# Patient Record
Sex: Male | Born: 1940 | Race: White | Hispanic: No | Smoking: Former smoker
Health system: Southern US, Community
[De-identification: ages and names within clinical notes are randomized; demographics above are authoritative.]

## PROBLEM LIST (undated history)

## (undated) DIAGNOSIS — J9611 Chronic respiratory failure with hypoxia: Secondary | ICD-10-CM

## (undated) DIAGNOSIS — R5383 Other fatigue: Secondary | ICD-10-CM

## (undated) DIAGNOSIS — R7302 Impaired glucose tolerance (oral): Secondary | ICD-10-CM

## (undated) DIAGNOSIS — Z86010 Personal history of colonic polyps: Secondary | ICD-10-CM

## (undated) DIAGNOSIS — G629 Polyneuropathy, unspecified: Secondary | ICD-10-CM

## (undated) DIAGNOSIS — H60399 Other infective otitis externa, unspecified ear: Secondary | ICD-10-CM

## (undated) DIAGNOSIS — E739 Lactose intolerance, unspecified: Secondary | ICD-10-CM

## (undated) DIAGNOSIS — E785 Hyperlipidemia, unspecified: Secondary | ICD-10-CM

## (undated) DIAGNOSIS — I10 Essential (primary) hypertension: Secondary | ICD-10-CM

## (undated) DIAGNOSIS — R5381 Other malaise: Secondary | ICD-10-CM

## (undated) HISTORY — DX: Polyneuropathy, unspecified: G62.9

## (undated) HISTORY — DX: Hyperlipidemia, unspecified: E78.5

## (undated) HISTORY — DX: Other infective otitis externa, unspecified ear: H60.399

## (undated) HISTORY — DX: Lactose intolerance, unspecified: E73.9

## (undated) HISTORY — DX: Other malaise: R53.81

## (undated) HISTORY — DX: Personal history of colonic polyps: Z86.010

## (undated) HISTORY — DX: Impaired glucose tolerance (oral): R73.02

## (undated) HISTORY — DX: Other fatigue: R53.83

## (undated) HISTORY — DX: Essential (primary) hypertension: I10

## (undated) HISTORY — DX: Chronic respiratory failure with hypoxia: J96.11

---

## 2005-08-09 ENCOUNTER — Ambulatory Visit: Payer: Self-pay | Admitting: Internal Medicine

## 2005-08-14 ENCOUNTER — Ambulatory Visit: Payer: Self-pay | Admitting: Internal Medicine

## 2005-08-30 ENCOUNTER — Ambulatory Visit: Payer: Self-pay | Admitting: Gastroenterology

## 2005-09-12 ENCOUNTER — Ambulatory Visit: Payer: Self-pay | Admitting: Gastroenterology

## 2006-10-11 ENCOUNTER — Ambulatory Visit: Payer: Self-pay | Admitting: Internal Medicine

## 2006-10-11 LAB — CONVERTED CEMR LAB
ALT: 23 units/L (ref 0–40)
AST: 27 units/L (ref 0–37)
Basophils Relative: 0.7 % (ref 0.0–1.0)
Bilirubin Urine: NEGATIVE
Calcium: 9.5 mg/dL (ref 8.4–10.5)
Chloride: 101 meq/L (ref 96–112)
Chol/HDL Ratio, serum: 5.8
Creatinine, Ser: 1 mg/dL (ref 0.4–1.5)
Eosinophil percent: 2 % (ref 0.0–5.0)
GFR calc non Af Amer: 80 mL/min
HCT: 46.4 % (ref 39.0–52.0)
HDL: 28.5 mg/dL — ABNORMAL LOW (ref >39.0)
Hemoglobin, Urine: NEGATIVE
Hemoglobin: 15.8 g/dL (ref 13.0–17.0)
Lymphocytes Relative: 24.4 % (ref 12.0–46.0)
MCHC: 34 g/dL (ref 30.0–36.0)
MCV: 93.2 fL (ref 78.0–100.0)
Monocytes Absolute: 0.5 10*3/uL (ref 0.2–0.7)
Neutro Abs: 5.4 10*3/uL (ref 1.4–7.7)
Neutrophils Relative %: 66.3 % (ref 43.0–77.0)
RBC: 4.98 M/uL (ref 4.22–5.81)
Sodium: 136 meq/L (ref 135–145)
Specific Gravity, Urine: <=1.005 (ref 1.000–1.03)
TSH: 1.53 microintl units/mL (ref 0.35–5.50)
Total Protein, Urine: NEGATIVE mg/dL
Triglyceride fasting, serum: 122 mg/dL (ref 0–149)
Urine Glucose: NEGATIVE mg/dL
VLDL: 24 mg/dL (ref 0–40)
WBC: 8.2 10*3/uL (ref 4.5–10.5)

## 2006-10-17 ENCOUNTER — Ambulatory Visit: Payer: Self-pay | Admitting: Internal Medicine

## 2008-12-29 ENCOUNTER — Encounter: Payer: Self-pay | Admitting: Internal Medicine

## 2009-02-14 ENCOUNTER — Encounter: Payer: Self-pay | Admitting: Internal Medicine

## 2009-07-26 ENCOUNTER — Ambulatory Visit: Payer: Self-pay | Admitting: Internal Medicine

## 2009-07-26 DIAGNOSIS — Z860100 Personal history of colon polyps, unspecified: Secondary | ICD-10-CM | POA: Insufficient documentation

## 2009-07-26 DIAGNOSIS — E739 Lactose intolerance, unspecified: Secondary | ICD-10-CM

## 2009-07-26 DIAGNOSIS — R5383 Other fatigue: Secondary | ICD-10-CM

## 2009-07-26 DIAGNOSIS — E785 Hyperlipidemia, unspecified: Secondary | ICD-10-CM

## 2009-07-26 DIAGNOSIS — R5381 Other malaise: Secondary | ICD-10-CM

## 2009-07-26 DIAGNOSIS — I10 Essential (primary) hypertension: Secondary | ICD-10-CM

## 2009-07-26 HISTORY — DX: Other malaise: R53.81

## 2009-07-26 HISTORY — DX: Personal history of colon polyps, unspecified: Z86.0100

## 2009-07-26 HISTORY — DX: Personal history of colonic polyps: Z86.010

## 2009-07-26 HISTORY — DX: Hyperlipidemia, unspecified: E78.5

## 2009-07-26 HISTORY — DX: Essential (primary) hypertension: I10

## 2009-07-26 HISTORY — DX: Lactose intolerance, unspecified: E73.9

## 2009-11-28 ENCOUNTER — Telehealth: Payer: Self-pay | Admitting: Internal Medicine

## 2009-11-28 DIAGNOSIS — H60399 Other infective otitis externa, unspecified ear: Secondary | ICD-10-CM

## 2009-11-28 HISTORY — DX: Other infective otitis externa, unspecified ear: H60.399

## 2010-04-04 ENCOUNTER — Telehealth: Payer: Self-pay | Admitting: Internal Medicine

## 2010-06-12 ENCOUNTER — Telehealth: Payer: Self-pay | Admitting: Internal Medicine

## 2010-08-03 ENCOUNTER — Encounter: Payer: Self-pay | Admitting: Internal Medicine

## 2010-08-03 ENCOUNTER — Ambulatory Visit: Payer: Self-pay | Admitting: Internal Medicine

## 2010-08-04 ENCOUNTER — Telehealth: Payer: Self-pay | Admitting: Internal Medicine

## 2010-08-04 LAB — CONVERTED CEMR LAB
AST: 26 units/L (ref 0–37)
Albumin: 4.2 g/dL (ref 3.5–5.2)
BUN: 15 mg/dL (ref 6–23)
Basophils Absolute: 0 10*3/uL (ref 0.0–0.1)
Bilirubin Urine: NEGATIVE
CO2: 26 meq/L (ref 19–32)
Eosinophils Absolute: 0.3 10*3/uL (ref 0.0–0.7)
Glucose, Bld: 99 mg/dL (ref 70–99)
HCT: 45 % (ref 39.0–52.0)
HDL: 35.9 mg/dL — ABNORMAL LOW (ref >39.00)
Hemoglobin: 15.8 g/dL (ref 13.0–17.0)
Iron: 101 ug/dL (ref 42–165)
Lymphs Abs: 3.1 10*3/uL (ref 0.7–4.0)
MCHC: 35.1 g/dL (ref 30.0–36.0)
MCV: 94.2 fL (ref 78.0–100.0)
Monocytes Absolute: 0.8 10*3/uL (ref 0.1–1.0)
Neutro Abs: 7.4 10*3/uL (ref 1.4–7.7)
Nitrite: NEGATIVE
PSA: 0.53 ng/mL (ref 0.10–4.00)
Potassium: 4.1 meq/L (ref 3.5–5.1)
RDW: 12.7 % (ref 11.5–14.6)
Sed Rate: 10 mm/hr (ref 0–22)
Sodium: 138 meq/L (ref 135–145)
TSH: 1.44 microintl units/mL (ref 0.35–5.50)
Total Protein, Urine: NEGATIVE mg/dL
Transferrin: 268.7 mg/dL (ref 212.0–360.0)
Urine Glucose: NEGATIVE mg/dL
VLDL: 79.6 mg/dL — ABNORMAL HIGH (ref 0.0–40.0)
pH: 5.5 (ref 5.0–8.0)

## 2010-08-09 ENCOUNTER — Encounter (INDEPENDENT_AMBULATORY_CARE_PROVIDER_SITE_OTHER): Payer: Self-pay | Admitting: *Deleted

## 2010-09-01 ENCOUNTER — Encounter (INDEPENDENT_AMBULATORY_CARE_PROVIDER_SITE_OTHER): Payer: Self-pay | Admitting: *Deleted

## 2010-09-01 ENCOUNTER — Encounter: Payer: Self-pay | Admitting: Internal Medicine

## 2010-09-06 ENCOUNTER — Ambulatory Visit: Payer: Self-pay | Admitting: Gastroenterology

## 2010-09-06 ENCOUNTER — Telehealth (INDEPENDENT_AMBULATORY_CARE_PROVIDER_SITE_OTHER): Payer: Self-pay | Admitting: *Deleted

## 2010-09-20 ENCOUNTER — Ambulatory Visit: Payer: Self-pay | Admitting: Gastroenterology

## 2010-09-20 LAB — HM COLONOSCOPY

## 2010-09-22 ENCOUNTER — Encounter: Payer: Self-pay | Admitting: Gastroenterology

## 2011-01-28 LAB — CONVERTED CEMR LAB
ALT: 25 units/L (ref 0–53)
BUN: 14 mg/dL (ref 6–23)
CO2: 29 meq/L (ref 19–32)
Chloride: 102 meq/L (ref 96–112)
Cholesterol: 206 mg/dL — ABNORMAL HIGH (ref 0–200)
Creatinine, Ser: 1 mg/dL (ref 0.4–1.5)
Eosinophils Absolute: 0.3 10*3/uL (ref 0.0–0.7)
Eosinophils Relative: 3 % (ref 0.0–5.0)
Glucose, Bld: 107 mg/dL — ABNORMAL HIGH (ref 70–99)
HCT: 44.9 % (ref 39.0–52.0)
Lymphs Abs: 2.6 10*3/uL (ref 0.7–4.0)
MCHC: 35 g/dL (ref 30.0–36.0)
MCV: 94.6 fL (ref 78.0–100.0)
Monocytes Absolute: 0.8 10*3/uL (ref 0.1–1.0)
Neutrophils Relative %: 62.3 % (ref 43.0–77.0)
Platelets: 234 10*3/uL (ref 150.0–400.0)
Potassium: 4.3 meq/L (ref 3.5–5.1)
TSH: 1.66 microintl units/mL (ref 0.35–5.50)
Total Bilirubin: 0.8 mg/dL (ref 0.3–1.2)
Total Protein: 7.9 g/dL (ref 6.0–8.3)
Triglycerides: 299 mg/dL — ABNORMAL HIGH (ref 0.0–149.0)
WBC: 9.7 10*3/uL (ref 4.5–10.5)

## 2011-01-30 NOTE — Progress Notes (Signed)
  Phone Note Refill Request  on April 04, 2010 11:54 AM  Refills Requested: Medication #1:  LISINOPRIL 5 MG TABS 1 by mouth once daily   Dosage confirmed as above?Dosage Confirmed   Notes: [facility] Dr. Sandre Kitty Mazon Initial call taken by: Scharlene Gloss,  April 04, 2010 11:54 AM    Prescriptions: LISINOPRIL 5 MG TABS (LISINOPRIL) 1 by mouth once daily  #90 x 0   Entered by:   Scharlene Gloss   Authorized by:   Corwin Levins MD   Signed by:   Scharlene Gloss on 04/04/2010   Method used:   Faxed to ...       [facility] [phone]* (retail)       [address]       [place], [place]  [address]       Ph: [phone]       Fax: [phone]   RxID:   [id number]

## 2011-01-30 NOTE — Letter (Signed)
Summary: Previsit letter  [facility]  [address] [place], [place] [address]   Phone: [phone]  Fax: [phone]       08/09/2010 MRN: [id number]  Derrick Petersen [address] [place], [place]  [address]  Dear Derrick Petersen,  Welcome to the Gastroenterology Division at [facility].    You are scheduled to see a nurse for your pre-procedure visit on 09-06-10 at 10:00a.m. on the 3rd floor at [facility], [address]  We ask that you try to arrive at our office 15 minutes prior to your appointment time to allow for check-in.  Your nurse visit will consist of discussing your medical and surgical history, your immediate family medical history, and your medications.    Please bring a complete list of all your medications or, if you prefer, bring the medication bottles and we will list them.  We will need to be aware of both prescribed and over the counter drugs.  We will need to know exact dosage information as well.  If you are on blood thinners (Coumadin, Plavix, Aggrenox, Ticlid, etc.) please call our office today/prior to your appointment, as we need to consult with your physician about holding your medication.   Please be prepared to read and sign documents such as consent forms, a financial agreement, and acknowledgement forms.  If necessary, and with your consent, a friend or relative is welcome to sit-in on the nurse visit with you.  Please bring your insurance card so that we may make a copy of it.  If your insurance requires a referral to see a specialist, please bring your referral form from your primary care physician.  No co-pay is required for this nurse visit.     If you cannot keep your appointment, please call [phone] to cancel or reschedule prior to your appointment date.  This allows [place] the opportunity to schedule an appointment for another patient in need of care.    Thank you for choosing [facility] for your medical needs.   We appreciate the opportunity to care for you.  Please visit [place] at our website  to learn more about our practice.                     Sincerely.                                                                                                                   The Gastroenterology Division

## 2011-01-30 NOTE — Progress Notes (Signed)
Summary: prep ?'s   Phone Note Call from Patient Call back at Home Phone [phone]   Caller: Patient Call For: Dr. Jarold Motto Reason for Call: Talk to Nurse Summary of Call: prep ?'s Initial call taken by: Vallarie Mare,  September 06, 2010 3:47 PM  Follow-up for Phone Call        Pt wanted to let us know that he was taking Lisinopril and wanted to know if it was okay to use Moviprep while taking Lisinopril.  Assured pt that it would be okay to take Lisinopril. Follow-up by: Ezra Sites RN,  September 06, 2010 4:21 PM

## 2011-01-30 NOTE — Letter (Signed)
Summary: [facility] Instructions  [facility]  [address] [place], [place] [address]   Phone: [phone]  Fax: [phone]       Derrick Petersen    69/25/1942    MRN: [id number]        Procedure Day Dorna Bloom:   Wednesday   09-20-10     Arrival Time:  7:30 a.m.     Procedure Time:  8:30 a.m.     Location of Procedure:                    _x _  [facility] (4th Floor)                       PREPARATION FOR COLONOSCOPY WITH MOVIPREP   Starting 5 days prior to your procedure  09-15-10 do not eat nuts, seeds, popcorn, corn, beans, peas,  salads, or any raw vegetables.  Do not take any fiber supplements (e.g. Metamucil, Citrucel, and Benefiber).  THE DAY BEFORE YOUR PROCEDURE         DATE:  09-19-10   DAY: Tuesday  1.  Drink clear liquids the entire day-NO SOLID FOOD  2.  Do not drink anything colored red or purple.  Avoid juices with pulp.  No orange juice.  3.  Drink at least 64 oz. (8 glasses) of fluid/clear liquids during the day to prevent dehydration and help the prep work efficiently.  CLEAR LIQUIDS INCLUDE: Water Jello Ice Popsicles Tea (sugar ok, no milk/cream) Powdered fruit flavored drinks Coffee (sugar ok, no milk/cream) Gatorade Juice: apple, white grape, white cranberry  Lemonade Clear bullion, consomm, broth Carbonated beverages (any kind) Strained chicken noodle soup Hard Candy                             4.  In the morning, mix first dose of MoviPrep solution:    Empty 1 Pouch A and 1 Pouch B into the disposable container    Add lukewarm drinking water to the top line of the container. Mix to dissolve    Refrigerate (mixed solution should be used within 24 hrs)  5.  Begin drinking the prep at 5:00 p.m. The MoviPrep container is divided by 4 marks.   Every 15 minutes drink the solution down to the next mark (approximately 8 oz) until the full liter is complete.   6.  Follow completed prep with 16 oz of clear liquid of your  choice (Nothing red or purple).  Continue to drink clear liquids until bedtime.  7.  Before going to bed, mix second dose of MoviPrep solution:    Empty 1 Pouch A and 1 Pouch B into the disposable container    Add lukewarm drinking water to the top line of the container. Mix to dissolve    Refrigerate  THE DAY OF YOUR PROCEDURE      DATE:  09-20-10  DAY: Wednesday  Beginning at  3:30 a.m. (5 hours before procedure):         1. Every 15 minutes, drink the solution down to the next mark (approx 8 oz) until the full liter is complete.  2. Follow completed prep with 16 oz. of clear liquid of your choice.    3. You may drink clear liquids until  6:30 a.m. (2 HOURS BEFORE PROCEDURE).   MEDICATION INSTRUCTIONS  Unless otherwise instructed, you should take regular prescription medications with a small  sip of water   as early as possible the morning of your procedure.       OTHER INSTRUCTIONS  You will need a responsible adult at least 69 years of age to accompany you and drive you home.   This person must remain in the waiting room during your procedure.  Wear loose fitting clothing that is easily removed.  Leave jewelry and other valuables at home.  However, you may wish to bring a book to read or  an iPod/MP3 player to listen to music as you wait for your procedure to start.  Remove all body piercing jewelry and leave at home.  Total time from sign-in until discharge is approximately 2-3 hours.  You should go home directly after your procedure and rest.  You can resume normal activities the  day after your procedure.  The day of your procedure you should not:   Drive   Make legal decisions   Operate machinery   Drink alcohol   Return to work  You will receive specific instructions about eating, activities and medications before you leave.    The above instructions have been reviewed and explained to me by   Wyona Almas RN  September 06, 2010 10:29  AM     I fully understand and can verbalize these instructions _____________________________ Date _________

## 2011-01-30 NOTE — Progress Notes (Signed)
Summary: Rx refill req  Phone Note Call from Patient Call back at Home Phone [phone]   Caller: Patient Summary of Call: pt called requesting #30 of BP meds to last until appt 08/04. Pt has medication now but will run out in July.  Initial call taken by: Margaret Pyle, CMA,  June 12, 2010 10:49 AM    Prescriptions: LISINOPRIL 5 MG TABS (LISINOPRIL) 1 by mouth once daily  #30 x 0   Entered by:   Margaret Pyle, CMA   Authorized by:   Corwin Levins MD   Signed by:   Margaret Pyle, CMA on 06/12/2010   Method used:   Electronically to        [facility] [phone]* (retail)       [address]       [place], [place]  [address]       Ph: [phone]       Fax: [phone]   RxID:   [id number]

## 2011-01-30 NOTE — Miscellaneous (Signed)
Summary: [facility] Previsit/prep  Clinical Lists Changes  Medications: Added new medication of MOVIPREP 100 GM  SOLR (PEG-KCL-NACL-NASULF-NA ASC-C) As per prep instructions. - Signed Rx of MOVIPREP 100 GM  SOLR (PEG-KCL-NACL-NASULF-NA ASC-C) As per prep instructions.;  #1 x 0;  Signed;  Entered by: Wyona Almas RN;  Authorized by: Mardella Layman MD [facility];  Method used: Electronically to [facility] [phone]*, [address] [place], [place]  [address], Ph: [phone], Fax: [phone] Observations: Added new observation of NKA: T (09/06/2010 10:06)    Prescriptions: MOVIPREP 100 GM  SOLR (PEG-KCL-NACL-NASULF-NA ASC-C) As per prep instructions.  #1 x 0   Entered by:   Wyona Almas RN   Authorized by:   Mardella Layman MD [facility]   Signed by:   Wyona Almas RN on 09/06/2010   Method used:   Electronically to        [facility] [phone]* (retail)       [address]       [place], [place]  [address]       Ph: [phone]       Fax: [phone]   RxID:   [id number]

## 2011-01-30 NOTE — Procedures (Signed)
Summary: Colonoscopy  Patient: [name] Note: All result statuses are Final unless otherwise noted.  Tests: (1) Colonoscopy (COL)   COL Colonoscopy           DONE     [facility]     [address]     [place], [place]  [address]           COLONOSCOPY PROCEDURE REPORT           PATIENT:  Derrick Petersen, Derrick Petersen  MR#:  [id number]     BIRTHDATE:  30-May-1941, 69 yrs. old  GENDER:  male     ENDOSCOPIST:  Vania Rea. Jarold Motto, MD, [facility]     REF. BY:     PROCEDURE DATE:  09/20/2010     PROCEDURE:  Colonoscopy with snare polypectomy     ASA CLASS:  Class II     INDICATIONS:  history of polyps     MEDICATIONS:   Fentanyl 50 mcg IV, Versed 5 gm IV           DESCRIPTION OF PROCEDURE:   After the risks benefits and     alternatives of the procedure were thoroughly explained, informed     consent was obtained.  Digital rectal exam was performed and     revealed no abnormalities.   The LB [id number] endoscope     was introduced through the anus and advanced to the cecum, which     was identified by both the appendix and ileocecal valve, without     limitations.  The quality of the prep was Moviprep fair.  The     instrument was then slowly withdrawn as the colon was fully     examined.     <<PROCEDUREIMAGES>>           FINDINGS:  A pedunculated polyp was found. flat,sessile 4mm polyp     cold snare excised fron cecum,,,  This was otherwise a normal     examination of the colon.   Retroflexed views in the rectum     revealed no abnormalities.    The scope was then withdrawn from     the patient and the procedure completed.           COMPLICATIONS:  None     ENDOSCOPIC IMPRESSION:     1) Pedunculated polyp     2) Otherwise normal examination     RECOMMENDATIONS:     1) If the polyp(s) removed today are proven to be adenomatous     (pre-cancerous) polyps, you will need a repeat colonoscopy in 5     years. Otherwise you should continue to follow colorectal cancer     screening  guidelines for "routine risk" patients with colonoscopy     in 10 years.     REPEAT EXAM:  No           ______________________________     Vania Rea. Jarold Motto, MD, Clementeen Graham           CC:           n.     eSIGNED:   Vania Rea. Patterson at 09/20/2010 09:14 AM           Charlaine Dalton, [phone]  Note: An exclamation mark (!) indicates a result that was not dispersed into the flowsheet. Document Creation Date: 09/20/2010 9:14 AM _______________________________________________________________________  (1) Order result status: Final Collection or observation date-time: 09/20/2010 09:05 Requested date-time:  Receipt date-time:  Reported date-time:  Referring Physician:  Ordering Physician: Sheryn Bison [phone]) Specimen Source:  Source: Launa Grill Order Number: [id number] Lab site:   Appended Document: Colonoscopy     Procedures Next Due Date:    Colonoscopy: 09/2015

## 2011-01-30 NOTE — Progress Notes (Signed)
  Phone Note From Pharmacy   Caller: Raynald Kemp [phone]* Summary of Call: Pharmacy requesting clarification, theNiacin CR 750  prescription should it be Niaspan? Initial call taken by: Robin Ewing CMA Duncan Dull),  August 04, 2010 11:36 AM  Follow-up for Phone Call        ok with me; the one I sent was the one that came up on the Eprescribe program Follow-up by: Corwin Levins MD,  August 04, 2010 11:40 AM    New/Updated Medications: NIASPAN 750 MG CR-TABS (NIACIN (ANTIHYPERLIPIDEMIC)) 1 by mouth once daily Prescriptions: NIASPAN 750 MG CR-TABS (NIACIN (ANTIHYPERLIPIDEMIC)) 1 by mouth once daily  #90 x 3   Entered by:   Zella Ball Ewing CMA (AAMA)   Authorized by:   Corwin Levins MD   Signed by:   Scharlene Gloss CMA (AAMA) on 08/04/2010   Method used:   Faxed to ...       [facility] [phone]* (retail)       [address]       [place], [place]  [address]       Ph: [phone]       Fax: [phone]   RxID:   [id number]

## 2011-01-30 NOTE — Letter (Signed)
Summary: Patient Notice- Polyp Results  [facility]  [address] [place], [place] [address]   Phone: [phone]  Fax: [phone]        September 22, 2010 MRN: [id number]    Derrick Petersen [address] [place], [place]  [address]    Dear Mr. BELSON,  I am pleased to inform you that the colon polyp(s) removed during your recent colonoscopy was (were) found to be benign (no cancer detected) upon pathologic examination.  I recommend you have a repeat colonoscopy examination in 5_ years to look for recurrent polyps, as having colon polyps increases your risk for having recurrent polyps or even colon cancer in the future.  Should you develop new or worsening symptoms of abdominal pain, bowel habit changes or bleeding from the rectum or bowels, please schedule an evaluation with either your primary care physician or with me.  Additional information/recommendations:  _x_ No further action with gastroenterology is needed at this time. Please      follow-up with your primary care physician for your other healthcare      needs.  __ Please call [phone] to schedule a return visit to review your      situation.  __ Please keep your follow-up visit as already scheduled.  __ Continue treatment plan as outlined the day of your exam.  Please call us if you are having persistent problems or have questions about your condition that have not been fully answered at this time.  Sincerely,  Mardella Layman MD [facility]  This letter has been electronically signed by your physician.  Appended Document: Patient Notice- Polyp Results letter mailed

## 2011-01-30 NOTE — Assessment & Plan Note (Signed)
Summary: yearly / medicare / #/cd   Vital Signs:  Patient profile:   70 year old male Height:      71 inches Weight:      214 pounds BMI:     29.95 O2 Sat:      96 % on Room air Temp:     97.1 degrees F oral Pulse rate:   65 / minute BP sitting:   148 / 80  (left arm) Cuff size:   large  Vitals Entered By: Zella Ball Ewing CMA Duncan Dull) (August 03, 2010 9:38 AM)  O2 Flow:  Room air  Preventive Care Screening  Colonoscopy:    Date:  01/01/2004    Next Due:  12/2008    Results:  normal   CC: yearly/RE   CC:  yearly/RE.  History of Present Illness: overall doing well, no specific complaints;  Pt denies CP, sob, doe, wheezing, orthopnea, pnd, worsening LE edema, palps, dizziness or syncope  Pt denies new neuro symptoms such as headache, facial or extremity weakness Denies polydipsia or polyuria.  No fever, wt loss, night sweats, loss of appetite or other constitutional symptoms  No worsening prostatism., reflux or allergies. 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 with slight hearing loss at higher frequencies Visual Acuity: Grossly normal, gets exam yearly, wears glasses ADL's: Capable  Fall Risk: None Home Safety: Good Cognitive Impairment:  Gen appearance, affect, speech, memory, attention & motor skills grossly intact End-of-Life Planning: Advance directive - Full code/I agree   Problems Prior to Update: 1)  Otitis Externa  (ICD-380.10) 2)  Colonic Polyps, Hx of  (ICD-V12.72) 3)  Glucose Intolerance  (ICD-271.3) 4)  Special Screening Malig Neoplasms Other Sites  (ICD-V76.49) 5)  Fatigue  (ICD-780.79) 6)  Hyperlipidemia  (ICD-272.4) 7)  Hypertension  (ICD-401.9)  Medications Prior to Update: 1)  Lisinopril 5 Mg Tabs (Lisinopril) .Marland Kitchen.. 1 By Mouth Once  Daily 2)  Aspir-Low 81 Mg Tbec (Aspirin) .... 2 By Mouth Once Daily 3)  Niacin Cr 500 Mg Cr-Caps (Niacin) .Marland Kitchen.. 1 By Mouth Once Daily  Current Medications (verified): 1)  Lisinopril 10 Mg Tabs (Lisinopril) .Marland Kitchen.. 1po Once Daily 2)  Aspir-Low 81 Mg Tbec (Aspirin) .... 2 By Mouth Once Daily 3)  Niacin Cr 750 Mg Cr-Tabs (Niacin) .Marland Kitchen.. 1po Once Daily 4)  Multivitamins  Tabs (Multiple Vitamin) .Marland Kitchen.. 1 By Mouth Once Daily 5)  Fish Oil 1000 Mg Caps (Omega-3 Fatty Acids) .Marland Kitchen.. 1 By Mouth Once Daily 6)  Vitamin D 1000 Unit Tabs (Cholecalciferol) .... 2 By Mouth Once Daily 7)  Flax Seed Oil 1000 Mg Caps (Flaxseed (Linseed)) .Marland Kitchen.. 1 By Mouth Once Daily 8)  Magnesium 300 Mg Caps (Magnesium) .Marland Kitchen.. 1 By Mouth Once Daily 9)  Iron 325 (65 Fe) Mg Tabs (Ferrous Sulfate) .Marland Kitchen.. 1 By Mouth Once Daily 10)  Glucosamine 500 Mg Caps (Glucosamine Sulfate) .Marland Kitchen.. 1 By Mouth Once Daily  Allergies (verified): No Known Drug Allergies  Past History:  Past Medical History: Last updated: 2009-08-02 Hypertension Hyperlipidemia DJD glucose intolerance Colonic polyps, hx of Erectile dysfunction  Past Surgical History: Last updated: 08-02-2009 Denies surgical history  Family History: Last updated: 2009/08/02 mother died with aspirate pneumonia, HTN colon cancer  - father died at 17yo DM - aunt  Social History: Last updated: Aug 02, 2009  retired [employer] hobby - computer/light wts Divorced no children Never Smoked Alcohol use-yes  Risk Factors: Smoking Status: never (07/26/2009)  Review of Systems  The patient denies anorexia, fever, vision loss, decreased hearing, hoarseness, chest pain, syncope, dyspnea on exertion, peripheral edema, prolonged cough, headaches, hemoptysis, abdominal pain, melena, hematochezia, severe indigestion/heartburn, hematuria, muscle weakness, suspicious skin lesions, transient blindness, difficulty walking, depression, unusual weight change, abnormal bleeding,  enlarged lymph nodes, and angioedema.         all otherwise negative per pt -  except for mild ongoing fatigue - without OSA symtpoms or worsening depressive symptoms   Physical Exam  General:  alert and overweight-appearing.   Head:  normocephalic and atraumatic.   Eyes:  vision grossly intact, pupils equal, and pupils round.   Ears:  R ear normal and L ear normal.   Nose:  no external deformity and no nasal discharge.   Mouth:  no gingival abnormalities and pharynx pink and moist.   Neck:  supple and no masses.   Lungs:  normal respiratory effort and normal breath sounds.   Heart:  normal rate and regular rhythm.   Abdomen:  soft, non-tender, and normal bowel sounds.   Msk:  no joint tenderness and no joint swelling.   Extremities:  no edema, no erythema  Neurologic:  cranial nerves II-XII intact and strength normal in all extremities.   Skin:  color normal and no rashes.   Psych:  not anxious appearing and not depressed appearing.     Impression & Recommendations:  Problem # 1:  Preventive Health Care (ICD-V70.0)  Overall doing well, age appropriate education and counseling updated and referral for appropriate preventive services done unless declined, immunizations up to date or declined, diet counseling done if overweight, urged to quit smoking if smokes , most recent labs reviewed and current ordered if appropriate, ecg reviewed or declined (interpretation per ECG scanned in the EMR if done); information regarding Medicare Prevention requirements given if appropriate; speciality referrals updated as appropriate   Orders: EKG w/ Interpretation (93000) First annual wellness visit with prevention plan  (X9147)  Problem # 2:  NEOPLASM, MALIGNANT, COLON, FAMILY HX, FATHER (ICD-V16.0) due for colonoscopy Orders: Gastroenterology Referral (GI)  Problem # 3:  FATIGUE (ICD-780.79)  exam benign, to check labs below; follow with expectant management   Orders: TLB-BMP (Basic  Metabolic Panel-BMET) (80048-METABOL) TLB-CBC Platelet - w/Differential (85025-CBCD) TLB-Hepatic/Liver Function Pnl (80076-HEPATIC) TLB-TSH (Thyroid Stimulating Hormone) (84443-TSH) TLB-Sedimentation Rate (ESR) (85652-ESR) TLB-IBC Pnl (Iron/FE;Transferrin) (83550-IBC) TLB-B12 + Folate Pnl (82746_82607-B12/FOL) TLB-Udip ONLY (81003-UDIP) T-Vitamin D (25-Hydroxy) (82956-21308)  Problem # 4:  GLUCOSE INTOLERANCE (ICD-271.3) aasympt - Pt to cont DM diet, excercise, wt loss efforts; to check labs today  Orders: TLB-A1C / Hgb A1C (Glycohemoglobin) (83036-A1C)  Problem # 5:  HYPERLIPIDEMIA (ICD-272.4) Assessment: Improved  His updated medication list for this problem includes:    Niacin Cr 750 Mg Cr-tabs (Niacin) .Marland Kitchen... 1po once daily  Labs Reviewed: SGOT: 28 (07/26/2009)   SGPT: 25 (07/26/2009)   HDL:31.30 (07/26/2009), 28.5 (10/11/2006)  LDL:112 CALC (10/11/2006)  Chol:206 (07/26/2009), 165 (10/11/2006)  Trig:299.0 (07/26/2009), 122 (10/11/2006) to incr the niacin as above, cont diet and f/u labs next visit  Orders: TLB-Lipid Panel (80061-LIPID) Prescription Created Electronically [phone])  Problem # 6:  HYPERTENSION (ICD-401.9)  His updated medication list for this problem includes:    Lisinopril 10 Mg Tabs (Lisinopril) .Marland Kitchen... 1po once daily  BP today: 148/80 Prior BP: 136/72 (07/26/2009)  Labs Reviewed: K+: 4.3 (07/26/2009)  Creat: : 1.0 (07/26/2009)   Chol: 206 (07/26/2009)   HDL: 31.30 (07/26/2009)   LDL: 112 CALC (10/11/2006)   TG: 299.0 (07/26/2009) to increase the ace as above - o/w stable overall by hx and exam, ok to continue meds/tx as is   Complete Medication List: 1)  Lisinopril 10 Mg Tabs (Lisinopril) .Marland Kitchen.. 1po once daily 2)  Aspir-low 81 Mg Tbec (Aspirin) .... 2 by mouth once daily 3)  Niacin Cr 750 Mg Cr-tabs (Niacin) .Marland Kitchen.. 1po once daily 4)  Multivitamins Tabs (Multiple vitamin) .Marland Kitchen.. 1 by mouth once daily 5)  Fish Oil 1000 Mg Caps (Omega-3 fatty acids) .Marland Kitchen.. 1 by  mouth once daily 6)  Vitamin D 1000 Unit Tabs (Cholecalciferol) .... 2 by mouth once daily 7)  Flax Seed Oil 1000 Mg Caps (Flaxseed (linseed)) .Marland Kitchen.. 1 by mouth once daily 8)  Magnesium 300 Mg Caps (Magnesium) .Marland Kitchen.. 1 by mouth once daily 9)  Iron 325 (65 Fe) Mg Tabs (Ferrous sulfate) .Marland Kitchen.. 1 by mouth once daily 10)  Glucosamine 500 Mg Caps (Glucosamine sulfate) .Marland Kitchen.. 1 by mouth once daily  Other Orders: TLB-PSA (Prostate Specific Antigen) (84153-PSA)  Patient Instructions: 1)  You will be contacted about the referral(s) to: colonoscopy 2)  Please go to the Lab in the basement for your blood and/or urine tests today 3)  increase the lisinopril to 10 mg per day 4)  increase the Niacin CR to 750 mg per day 5)  Continue all other previous medications as before this visit 6)  Please schedule a follow-up appointment in 1 year or sooner if needed Prescriptions: LISINOPRIL 10 MG TABS (LISINOPRIL) 1po once daily  #90 x 3   Entered and Authorized by:   Corwin Levins MD   Signed by:   Corwin Levins MD on 08/03/2010   Method used:   Electronically to        [facility] [phone]* (retail)       [address]       [place], [place]  [address]       Ph: [phone]       Fax: [phone]   RxID:   [id number] NIACIN CR 750 MG CR-TABS (NIACIN) 1po once daily  #90 x 3   Entered and Authorized by:   Corwin Levins MD   Signed by:   Corwin Levins MD on 08/03/2010   Method used:   Electronically to        [facility] [phone]* (retail)       [address]       [place], [place]  [address]       Ph: [phone]       Fax: [phone]   RxID:   [id number]

## 2011-08-02 ENCOUNTER — Other Ambulatory Visit: Payer: Self-pay | Admitting: Internal Medicine

## 2011-08-05 ENCOUNTER — Encounter: Payer: Self-pay | Admitting: Internal Medicine

## 2011-08-05 DIAGNOSIS — Z0000 Encounter for general adult medical examination without abnormal findings: Secondary | ICD-10-CM | POA: Insufficient documentation

## 2011-08-05 DIAGNOSIS — R7302 Impaired glucose tolerance (oral): Secondary | ICD-10-CM

## 2011-08-05 HISTORY — DX: Impaired glucose tolerance (oral): R73.02

## 2011-08-08 ENCOUNTER — Ambulatory Visit (INDEPENDENT_AMBULATORY_CARE_PROVIDER_SITE_OTHER): Payer: Medicare Other | Admitting: Internal Medicine

## 2011-08-08 ENCOUNTER — Other Ambulatory Visit: Payer: Self-pay | Admitting: Internal Medicine

## 2011-08-08 ENCOUNTER — Encounter: Payer: Self-pay | Admitting: Internal Medicine

## 2011-08-08 ENCOUNTER — Other Ambulatory Visit (INDEPENDENT_AMBULATORY_CARE_PROVIDER_SITE_OTHER): Payer: Medicare Other

## 2011-08-08 VITALS — BP 134/82 | HR 69 | Temp 97.3°F | Ht 71.0 in | Wt 219.1 lb

## 2011-08-08 DIAGNOSIS — R7302 Impaired glucose tolerance (oral): Secondary | ICD-10-CM

## 2011-08-08 DIAGNOSIS — R7309 Other abnormal glucose: Secondary | ICD-10-CM

## 2011-08-08 DIAGNOSIS — N320 Bladder-neck obstruction: Secondary | ICD-10-CM | POA: Insufficient documentation

## 2011-08-08 DIAGNOSIS — R5383 Other fatigue: Secondary | ICD-10-CM

## 2011-08-08 DIAGNOSIS — R5381 Other malaise: Secondary | ICD-10-CM

## 2011-08-08 DIAGNOSIS — E785 Hyperlipidemia, unspecified: Secondary | ICD-10-CM

## 2011-08-08 LAB — LIPID PANEL
HDL: 36.8 mg/dL — ABNORMAL LOW (ref >39.00)
Total CHOL/HDL Ratio: 6
Triglycerides: 373 mg/dL — ABNORMAL HIGH (ref 0.0–149.0)
VLDL: 74.6 mg/dL — ABNORMAL HIGH (ref 0.0–40.0)

## 2011-08-08 LAB — CBC WITH DIFFERENTIAL/PLATELET
Basophils Relative: 0.4 % (ref 0.0–3.0)
Eosinophils Relative: 2.9 % (ref 0.0–5.0)
HCT: 47.3 % (ref 39.0–52.0)
Hemoglobin: 16.4 g/dL (ref 13.0–17.0)
Lymphs Abs: 2.6 10*3/uL (ref 0.7–4.0)
MCV: 95 fl (ref 78.0–100.0)
Monocytes Absolute: 0.8 10*3/uL (ref 0.1–1.0)
Monocytes Relative: 7 % (ref 3.0–12.0)
Platelets: 250 10*3/uL (ref 150.0–400.0)
RBC: 4.98 Mil/uL (ref 4.22–5.81)
WBC: 11 10*3/uL — ABNORMAL HIGH (ref 4.5–10.5)

## 2011-08-08 LAB — TSH: TSH: 1.43 u[IU]/mL (ref 0.35–5.50)

## 2011-08-08 LAB — BASIC METABOLIC PANEL
BUN: 15 mg/dL (ref 6–23)
Calcium: 9.8 mg/dL (ref 8.4–10.5)
Creatinine, Ser: 1.1 mg/dL (ref 0.4–1.5)
GFR: 71.22 mL/min (ref >60.00)
Glucose, Bld: 105 mg/dL — ABNORMAL HIGH (ref 70–99)
Potassium: 4.8 mEq/L (ref 3.5–5.1)

## 2011-08-08 LAB — URINALYSIS, ROUTINE W REFLEX MICROSCOPIC
Bilirubin Urine: NEGATIVE
Ketones, ur: NEGATIVE
Leukocytes, UA: NEGATIVE
Nitrite: NEGATIVE
Specific Gravity, Urine: 1.015 (ref 1.000–1.030)
Total Protein, Urine: NEGATIVE
pH: 6.5 (ref 5.0–8.0)

## 2011-08-08 LAB — HEPATIC FUNCTION PANEL
Albumin: 4.3 g/dL (ref 3.5–5.2)
Total Bilirubin: 0.7 mg/dL (ref 0.3–1.2)

## 2011-08-08 LAB — PSA: PSA: 0.58 ng/mL (ref 0.10–4.00)

## 2011-08-08 MED ORDER — LISINOPRIL 10 MG PO TABS: 10.0000 mg | ORAL_TABLET | Freq: Every day | ORAL | Status: DC

## 2011-08-08 MED ORDER — NIACIN ER (ANTIHYPERLIPIDEMIC) 500 MG PO TBCR: 500.0000 mg | EXTENDED_RELEASE_TABLET | Freq: Every day | ORAL | Status: DC

## 2011-08-08 MED ORDER — ASPIRIN 81 MG PO TABS: ORAL_TABLET | ORAL | Status: DC

## 2011-08-08 NOTE — Patient Instructions (Signed)
Continue all other medications as before Please go to LAB in the Basement for the blood and/or urine tests to be done today Please call the phone number [phone] (the PhoneTree System) for results of testing in 2-3 days;  When calling, simply dial the number, and when prompted enter the MRN number above (the Medical Record Number) and the # key, then the message should start. You are otherwise up to date with prevention measures Your medication refill was sent to the pharmacy Please return in 1 year for your yearly visit, or sooner if needed

## 2011-08-08 NOTE — Progress Notes (Signed)
Subjective:    Patient ID: Derrick Petersen, male    DOB: 10/10/1941, 70 y.o.   MRN: [id number]  HPI Here for f/u;  Overall doing ok;  Pt denies CP, worsening SOB, DOE, wheezing, orthopnea, PND, worsening LE edema, palpitations, dizziness or syncope.  Pt denies neurological change such as new Headache, facial or extremity weakness.  Pt denies polydipsia, polyuria, or low sugar symptoms. Pt states overall good compliance with treatment and medications, good tolerability, and trying to follow lower cholesterol diet.  Pt denies worsening depressive symptoms, suicidal ideation or panic. No fever, wt loss, night sweats, loss of appetite, or other constitutional symptoms.  Pt states good ability with ADL's, low fall risk, home safety reviewed and adequate, no significant changes in hearing or vision, and occasionally active with exercise. Did have recent cxr, echo, and stress test and neg PFT sounding testing neg for emphysema per pt despite hx of smoking. Had some increased pulm pressures on echo, and mild aortic valve thickening only - all per Dr Shelva Majestic [facility]  [facility]. Has joined the gym at 3 times per wk with both bike ridiing and some wt lifting.  Etiology unclear, Exam otherwise benign, to check labs as documented, follow with expectant management  Past Medical History  Diagnosis Date  . COLONIC POLYPS, HX OF 07/26/2009  . FATIGUE 07/26/2009  . GLUCOSE INTOLERANCE 07/26/2009  . HYPERLIPIDEMIA 07/26/2009  . HYPERTENSION 07/26/2009  . OTITIS EXTERNA 11/28/2009  . Impaired glucose tolerance 08/05/2011   No past surgical history on file.  reports that he has never smoked. He does not have any smokeless tobacco history on file. He reports that he drinks alcohol. His drug history not on file. family history includes Diabetes in his other and Hypertension in his mother. No Known Allergies Current Outpatient Prescriptions on File Prior to Visit  Medication Sig Dispense Refill  . aspirin 81 MG  tablet Take 81 mg by mouth daily.        . cholecalciferol (VITAMIN D) 1000 UNITS tablet 2 by mouth once daily       . ferrous gluconate (FERGON) 325 MG tablet Take 325 mg by mouth daily.        . Flaxseed, Linseed, (FLAX SEED OIL) 1000 MG CAPS Take by mouth daily.        . Glucosamine 500 MG CAPS Take by mouth daily.        Marland Kitchen lisinopril (PRINIVIL,ZESTRIL) 10 MG tablet TAKE ONE TABLET BY MOUTH EVERY DAY  30 tablet  1  . Magnesium 300 MG CAPS Take by mouth daily.        . Multiple Vitamin (MULTIVITAMIN) capsule Take 1 capsule by mouth daily.        . niacin (NIASPAN) 750 MG CR tablet Take 750 mg by mouth daily.        . Omega-3 Fatty Acids (FISH OIL) 1000 MG CAPS Take by mouth daily.          Review of Systems Review of Systems  Constitutional: Negative for diaphoresis, activity change, appetite change and unexpected weight change.  HENT: Negative for hearing loss, ear pain, facial swelling, mouth sores and neck stiffness.   Eyes: Negative for pain, redness and visual disturbance.  Respiratory: Negative for shortness of breath and wheezing.   Cardiovascular: Negative for chest pain and palpitations.  Gastrointestinal: Negative for diarrhea, blood in stool, abdominal distention and rectal pain.  Genitourinary: Negative for hematuria, flank pain and decreased urine volume.  Musculoskeletal:  Negative for myalgias and joint swelling.  Skin: Negative for color change and wound.  Neurological: Negative for syncope and numbness.  Hematological: Negative for adenopathy.  Psychiatric/Behavioral: Negative for hallucinations, self-injury, decreased concentration and agitation.      Objective:   Physical Exam BP 134/82  Pulse 69  Temp(Src) 97.3 F (36.3 C) (Oral)  Ht 5\' 11"  (1.803 m)  Wt 219 lb 2 oz (99.394 kg)  BMI 30.56 kg/m2  SpO2 99% Physical Exam  VS noted Constitutional: Pt is oriented to person, place, and time. Appears well-developed and well-nourished.  HENT:  Head: Normocephalic  and atraumatic.  Right Ear: External ear normal.  Left Ear: External ear normal.  Nose: Nose normal.  Mouth/Throat: Oropharynx is clear and moist.  Eyes: Conjunctivae and EOM are normal. Pupils are equal, round, and reactive to light.  Neck: Normal range of motion. Neck supple. No JVD present. No tracheal deviation present.  Cardiovascular: Normal rate, regular rhythm, normal heart sounds and intact distal pulses.   Pulmonary/Chest: Effort normal and breath sounds normal.  Abdominal: Soft. Bowel sounds are normal. There is no tenderness.  Musculoskeletal: Normal range of motion. Exhibits no edema.  Lymphadenopathy:  Has no cervical adenopathy.  Neurological: Pt is alert and oriented to person, place, and time. Pt has normal reflexes. No cranial nerve deficit.  Skin: Skin is warm and dry. No rash noted.  Psychiatric:  Has  normal mood and affect. Behavior is normal.         Assessment & Plan:

## 2011-08-09 LAB — VITAMIN D 25 HYDROXY (VIT D DEFICIENCY, FRACTURES): Vit D, 25-Hydroxy: 53 ng/mL (ref 30–89)

## 2011-08-09 NOTE — Progress Notes (Signed)
Quick Note:  Voice message left on PhoneTree system - lab is negative, normal or otherwise stable, pt to continue same tx ______ 

## 2011-08-11 ENCOUNTER — Encounter: Payer: Self-pay | Admitting: Internal Medicine

## 2011-08-11 NOTE — Assessment & Plan Note (Signed)
Etiology unclear, Exam otherwise benign, to check labs as documented, follow with expectant management  

## 2011-08-11 NOTE — Assessment & Plan Note (Signed)
stable overall by hx and exam, most recent data reviewed with pt, and pt to continue medical treatment as before  Lab Results  Component Value Date   LDLCALC 112 CALC* 10/11/2006

## 2011-08-11 NOTE — Assessment & Plan Note (Signed)
stable overall by hx and exam, most recent data reviewed with pt, and pt to continue medical treatment as before  Lab Results  Component Value Date   HGBA1C 5.3 08/08/2011

## 2011-08-11 NOTE — Assessment & Plan Note (Signed)
stable overall by hx and exam, most recent data reviewed with pt, and pt to continue medical treatment as before  Lab Results  Component Value Date   PSA 0.58 08/08/2011   PSA 0.53 08/03/2010   PSA 0.51 07/26/2009

## 2012-08-13 ENCOUNTER — Ambulatory Visit (INDEPENDENT_AMBULATORY_CARE_PROVIDER_SITE_OTHER): Payer: Medicare Other | Admitting: Internal Medicine

## 2012-08-13 ENCOUNTER — Encounter: Payer: Self-pay | Admitting: Internal Medicine

## 2012-08-13 ENCOUNTER — Other Ambulatory Visit (INDEPENDENT_AMBULATORY_CARE_PROVIDER_SITE_OTHER): Payer: Medicare Other

## 2012-08-13 VITALS — BP 130/82 | HR 77 | Temp 97.0°F | Ht 71.0 in | Wt 220.5 lb

## 2012-08-13 DIAGNOSIS — R7309 Other abnormal glucose: Secondary | ICD-10-CM

## 2012-08-13 DIAGNOSIS — N320 Bladder-neck obstruction: Secondary | ICD-10-CM

## 2012-08-13 DIAGNOSIS — M545 Low back pain: Secondary | ICD-10-CM | POA: Insufficient documentation

## 2012-08-13 DIAGNOSIS — Z136 Encounter for screening for cardiovascular disorders: Secondary | ICD-10-CM

## 2012-08-13 DIAGNOSIS — E785 Hyperlipidemia, unspecified: Secondary | ICD-10-CM

## 2012-08-13 DIAGNOSIS — I10 Essential (primary) hypertension: Secondary | ICD-10-CM

## 2012-08-13 DIAGNOSIS — R7302 Impaired glucose tolerance (oral): Secondary | ICD-10-CM

## 2012-08-13 DIAGNOSIS — R29898 Other symptoms and signs involving the musculoskeletal system: Secondary | ICD-10-CM | POA: Insufficient documentation

## 2012-08-13 LAB — CBC WITH DIFFERENTIAL/PLATELET
Basophils Absolute: 0 10*3/uL (ref 0.0–0.1)
HCT: 47.2 % (ref 39.0–52.0)
Hemoglobin: 16.1 g/dL (ref 13.0–17.0)
Lymphs Abs: 2.3 10*3/uL (ref 0.7–4.0)
MCHC: 34.1 g/dL (ref 30.0–36.0)
Monocytes Relative: 7 % (ref 3.0–12.0)
Neutro Abs: 8.1 10*3/uL — ABNORMAL HIGH (ref 1.4–7.7)
RDW: 13.2 % (ref 11.5–14.6)

## 2012-08-13 LAB — HEPATIC FUNCTION PANEL
Albumin: 4.2 g/dL (ref 3.5–5.2)
Alkaline Phosphatase: 52 U/L (ref 39–117)
Bilirubin, Direct: 0.1 mg/dL (ref 0.0–0.3)
Total Bilirubin: 0.3 mg/dL (ref 0.3–1.2)

## 2012-08-13 LAB — BASIC METABOLIC PANEL
BUN: 19 mg/dL (ref 6–23)
Calcium: 9.7 mg/dL (ref 8.4–10.5)
Creatinine, Ser: 1 mg/dL (ref 0.4–1.5)
GFR: 79.35 mL/min (ref >60.00)
Glucose, Bld: 102 mg/dL — ABNORMAL HIGH (ref 70–99)
Potassium: 5.2 mEq/L — ABNORMAL HIGH (ref 3.5–5.1)

## 2012-08-13 LAB — LIPID PANEL
Cholesterol: 196 mg/dL (ref 0–200)
HDL: 38 mg/dL — ABNORMAL LOW (ref >39.00)
Total CHOL/HDL Ratio: 5
Triglycerides: 347 mg/dL — ABNORMAL HIGH (ref 0.0–149.0)
VLDL: 69.4 mg/dL — ABNORMAL HIGH (ref 0.0–40.0)

## 2012-08-13 LAB — URINALYSIS, ROUTINE W REFLEX MICROSCOPIC
Ketones, ur: NEGATIVE
Specific Gravity, Urine: 1.01 (ref 1.000–1.030)
Urine Glucose: NEGATIVE
Urobilinogen, UA: 0.2 (ref 0.0–1.0)

## 2012-08-13 NOTE — Patient Instructions (Addendum)
Continue all other medications as before Please have the pharmacy call with any refills you may need. You will be contacted regarding the referral for: MRI for the lower back, and Neurology referral Please go to LAB in the Basement for the blood and/or urine tests to be done today You will be contacted by phone if any changes need to be made immediately.  Otherwise, you will receive a letter about your results with an explanation. Please return in 1 year for your yearly visit, or sooner if needed

## 2012-08-13 NOTE — Progress Notes (Signed)
Subjective:    Patient ID: Derrick Palau., male    DOB: [birth year], 71 y.o.   MRN: [id number]  HPI Here to f/u; overall doing ok,  Pt denies chest pain, increased sob or doe, wheezing, orthopnea, PND, increased LE swelling, palpitations, dizziness or syncope.  Pt denies new neurological symptoms such as new headache, or facial or extremity weakness or numbness   Pt denies polydipsia, polyuria, or low sugar symptoms such as weakness or confusion improved with po intake.  Pt states overall good compliance with meds, trying to follow lower cholesterol, diabetic diet, wt overall stable but little exercise however.  Does have a "tiredness" to the upper legs with weakness after walking some distance somewhat reproducible at 50 ft, Pt continues to have recurring LBP without change in severity, bowel or bladder change, fever, wt loss,  worsening LE pain/numbness/ gait change or falls.  Did dig a grave for his large dog recent.  Does go to gym with some wt lifting. Does not take statin, does take co-Q 10.  No other acute complaints.  Denies worsening depressive symptoms, suicidal ideation, or panic.  Denies worsening prostatism such as slower flow.   Past Medical History  Diagnosis Date  . COLONIC POLYPS, HX OF 07/26/2009  . FATIGUE 07/26/2009  . GLUCOSE INTOLERANCE 07/26/2009  . HYPERLIPIDEMIA 07/26/2009  . HYPERTENSION 07/26/2009  . OTITIS EXTERNA 11/28/2009  . Impaired glucose tolerance 08/05/2011   No past surgical history on file.  reports that he has never smoked. He does not have any smokeless tobacco history on file. He reports that he drinks alcohol. His drug history not on file. family history includes Diabetes in his other and Hypertension in his mother. No Known Allergies  Current Outpatient Prescriptions on File Prior to Visit  Medication Sig Dispense Refill  . aspirin 81 MG tablet 2 tabs by mouth once daily  90 tablet  3  . B Complex-C (SUPER B COMPLEX PO) Take by mouth daily.        .  cholecalciferol (VITAMIN D) 1000 UNITS tablet 2 by mouth once daily       . ferrous gluconate (FERGON) 325 MG tablet Take 325 mg by mouth daily.        Marland Kitchen lisinopril (PRINIVIL,ZESTRIL) 10 MG tablet Take 1 tablet (10 mg total) by mouth daily.  90 tablet  3  . Magnesium 300 MG CAPS Take by mouth daily.        . Multiple Vitamin (MULTIVITAMIN) capsule Take 1 capsule by mouth daily.        . Omega-3 Fatty Acids (FISH OIL) 1000 MG CAPS Take by mouth daily.        . niacin (NIASPAN) 500 MG CR tablet Take 1 tablet (500 mg total) by mouth at bedtime.  30 tablet  3     Review of Systems Review of Systems  Constitutional: Negative for diaphoresis, activity change, appetite change and unexpected weight change.  HENT: Negative for hearing loss, ear pain, facial swelling, mouth sores and neck stiffness.   Eyes: Negative for pain, redness and visual disturbance.  Respiratory: Negative for shortness of breath and wheezing.   Cardiovascular: Negative for chest pain and palpitations.  Gastrointestinal: Negative for diarrhea, blood in stool, abdominal distention and rectal pain.  Genitourinary: Negative for hematuria, flank pain and decreased urine volume.  Musculoskeletal: Negative for myalgias and joint swelling.  Skin: Negative for color change and wound.  Neurological: Negative for syncope and numbness.  Hematological: Negative for adenopathy.  Psychiatric/Behavioral: Negative for hallucinations, self-injury, decreased concentration and agitation.      Objective:   Physical Exam BP 130/82  Pulse 77  Temp 97 F (36.1 C) (Oral)  Ht 5\' 11"  (1.803 m)  Wt 220 lb 8 oz (100.018 kg)  BMI 30.75 kg/m2  SpO2 97% Physical Exam  VS noted Constitutional: Pt is oriented to person, place, and time. Appears well-developed and well-nourished.  HENT:  Head: Normocephalic and atraumatic.  Right Ear: External ear normal.  Left Ear: External ear normal.  Nose: Nose normal.  Mouth/Throat: Oropharynx is clear and  moist.  Eyes: Conjunctivae and EOM are normal. Pupils are equal, round, and reactive to light.  Neck: Normal range of motion. Neck supple. No JVD present. No tracheal deviation present.  Cardiovascular: Normal rate, regular rhythm, normal heart sounds and intact distal pulses.   Pulmonary/Chest: Effort normal and breath sounds normal.  Abdominal: Soft. Bowel sounds are normal. There is no tenderness.  Musculoskeletal: Normal range of motion. Exhibits no edema.  Lymphadenopathy:  Has no cervical adenopathy.  Neurological: Pt is alert and oriented to person, place, and time. Pt has normal reflexes. No cranial nerve deficit. Motor/dtr/gait intact Skin: Skin is warm and dry. No rash noted.  Psychiatric:  Has  normal mood and affect. Behavior is normal.     Assessment & Plan:

## 2012-08-13 NOTE — Assessment & Plan Note (Signed)
ECG reviewed as per emr, stable overall by hx and exam, most recent data reviewed with pt, and pt to continue medical treatment as before BP Readings from Last 3 Encounters:  08/13/12 130/82  08/08/11 134/82  08/03/10 148/80

## 2012-08-17 ENCOUNTER — Encounter: Payer: Self-pay | Admitting: Internal Medicine

## 2012-08-17 NOTE — Assessment & Plan Note (Signed)
stable overall by hx and exam, most recent data reviewed with pt, and pt to continue medical treatment as before  Lab Results  Component Value Date   LDLCALC 112 CALC* 10/11/2006    

## 2012-08-17 NOTE — Assessment & Plan Note (Signed)
With leg weakness with ambulation - for MRI LS Spine, r/o spinal stenosis

## 2012-08-17 NOTE — Assessment & Plan Note (Signed)
Also for PSA as he is due 

## 2012-08-17 NOTE — Assessment & Plan Note (Signed)
Asympt, for a1c, cont diet and wt control,  to f/u any worsening symptoms or concerns

## 2012-08-17 NOTE — Assessment & Plan Note (Addendum)
As above with recurrent back pain, also for neurology referral

## 2012-08-18 ENCOUNTER — Encounter: Payer: Self-pay | Admitting: Internal Medicine

## 2012-08-18 ENCOUNTER — Other Ambulatory Visit: Payer: Medicare Other

## 2012-08-18 ENCOUNTER — Ambulatory Visit
Admission: RE | Admit: 2012-08-18 | Discharge: 2012-08-18 | Disposition: A | Discharge status: Home / Self Care | Payer: Medicare Other | Source: Ambulatory Visit | Attending: Internal Medicine | Admitting: Internal Medicine

## 2012-08-18 DIAGNOSIS — R29898 Other symptoms and signs involving the musculoskeletal system: Secondary | ICD-10-CM

## 2012-08-18 DIAGNOSIS — M545 Low back pain: Secondary | ICD-10-CM

## 2012-08-21 ENCOUNTER — Telehealth: Payer: Self-pay | Admitting: *Deleted

## 2012-08-21 NOTE — Telephone Encounter (Signed)
Letter was sent with result on aug 19, may not have reached him yet  MRI showed signficant diffuse degenerative changes, and at the lower levels has on Both sides the possibility of places where the nerve could be pinched;  There was no specific place that looked more severe, so hard to say which one place might be causing his pain   Ok to cont same tx, and plan to f/u with surgury as planned for futher evaluation and tx

## 2012-08-21 NOTE — Telephone Encounter (Signed)
Left msg on triage wanting MRI results & what is his next step to do...08/21/12@9 :19am/LMB

## 2012-08-21 NOTE — Telephone Encounter (Signed)
Called informed the patient of results and he would like a CD to take to his neurologist appt. On Sept. 3, please advise

## 2012-08-21 NOTE — Telephone Encounter (Signed)
Ok with me, but the CD would need to be asked from the facility that performed the study

## 2012-08-22 NOTE — Telephone Encounter (Signed)
Called the patient left message to call back 

## 2012-08-25 ENCOUNTER — Other Ambulatory Visit: Payer: Self-pay | Admitting: Internal Medicine

## 2012-08-25 NOTE — Telephone Encounter (Signed)
Called patient back left message

## 2012-08-25 NOTE — Telephone Encounter (Signed)
Called the patient informed to pickup at location to take to MD per [facility] as should not be any cost.  To call our office back if have any problems.

## 2012-09-12 ENCOUNTER — Encounter: Payer: Self-pay | Admitting: Internal Medicine

## 2012-09-12 DIAGNOSIS — G629 Polyneuropathy, unspecified: Secondary | ICD-10-CM

## 2012-09-12 HISTORY — DX: Polyneuropathy, unspecified: G62.9

## 2012-09-18 ENCOUNTER — Encounter (INDEPENDENT_AMBULATORY_CARE_PROVIDER_SITE_OTHER): Payer: Medicare Other

## 2012-09-18 ENCOUNTER — Other Ambulatory Visit: Payer: Self-pay | Admitting: Cardiology

## 2012-09-18 DIAGNOSIS — I739 Peripheral vascular disease, unspecified: Secondary | ICD-10-CM

## 2012-09-18 DIAGNOSIS — I70219 Atherosclerosis of native arteries of extremities with intermittent claudication, unspecified extremity: Secondary | ICD-10-CM

## 2012-10-13 ENCOUNTER — Telehealth: Payer: Self-pay | Admitting: Internal Medicine

## 2012-10-13 MED ORDER — VALACYCLOVIR HCL 1 G PO TABS: 1000.0000 mg | ORAL_TABLET | Freq: Three times a day (TID) | ORAL | Status: DC

## 2012-10-13 NOTE — Telephone Encounter (Signed)
Caller: Lucian/Patient; Patient Name: Derrick Petersen; PCP: Oliver Barre (Adults only); Best Callback Phone Number: [phone]; Reason for call: Shingles, onset 10-10.  Lesions are on Right Arm and Right side of Back.  Pt taking Aleve q 12 hrs and Asprin for pain, pain is bearable. Afebrile. Pt requesting anti-viral, Valtrex, script to be called in. All emergent symptoms ruled out per Skin Lesions protocol, see in 24 hrs due to painful lesions unrelieved with home care.  Pt last seen on 08-13-12. Pt uses [facility], [phone]. Please follow up with Pt if script can be called in.

## 2012-10-13 NOTE — Telephone Encounter (Signed)
Done erx 

## 2012-10-13 NOTE — Telephone Encounter (Signed)
Called left detailed message that a prescription for symptoms has been sent to his pharmacy

## 2012-10-13 NOTE — Telephone Encounter (Signed)
Called the patient and was busy signal.

## 2012-10-31 ENCOUNTER — Telehealth: Payer: Self-pay | Admitting: Internal Medicine

## 2012-10-31 DIAGNOSIS — R0600 Dyspnea, unspecified: Secondary | ICD-10-CM

## 2012-10-31 NOTE — Telephone Encounter (Signed)
I reveiwed his chart and dont really see a recent good reason for this, like worsening sob or similar problem  Can he say why he feels he needs to do this

## 2012-10-31 NOTE — Telephone Encounter (Signed)
Patient would like to be referred for a Pulmonary function test

## 2012-10-31 NOTE — Telephone Encounter (Signed)
Called the patient and he stated he has had 2 pulmonary function test at [facility] (2010 and 2011) the last test had changed.  He stated he had difficulty absorbing oxygen.   He is having some SOB.  He also stated he has not gotten his MRI results or report from Dr. Terrace Arabia.  He did state he has no confidence in [facility] and also his Insurance changes at the first of the yr. And has no idea what his coverage will be.

## 2012-10-31 NOTE — Telephone Encounter (Signed)
Patient informed. 

## 2012-10-31 NOTE — Telephone Encounter (Signed)
Done per emr 

## 2012-11-13 ENCOUNTER — Ambulatory Visit (INDEPENDENT_AMBULATORY_CARE_PROVIDER_SITE_OTHER): Payer: Medicare Other | Admitting: Internal Medicine

## 2012-11-13 DIAGNOSIS — R0602 Shortness of breath: Secondary | ICD-10-CM

## 2012-11-13 LAB — PULMONARY FUNCTION TEST

## 2012-11-13 NOTE — Progress Notes (Signed)
PFT done today. 

## 2012-11-20 ENCOUNTER — Telehealth: Payer: Self-pay | Admitting: Internal Medicine

## 2012-11-20 NOTE — Telephone Encounter (Signed)
Patient informed of results.  The patient would like a copy of results mailed to his home.

## 2012-11-20 NOTE — Telephone Encounter (Signed)
Robin to let pt know;  PFT's showed only mild COPD, without significant improvement with the bronchodilator as well  OK to cont current tx; results can be further d/w at next appt

## 2012-11-28 ENCOUNTER — Encounter: Payer: Self-pay | Admitting: Internal Medicine

## 2012-12-01 ENCOUNTER — Telehealth: Payer: Self-pay | Admitting: *Deleted

## 2012-12-01 NOTE — Telephone Encounter (Signed)
Pt requesting results of PFT done on 11/13/2012.

## 2012-12-01 NOTE — Telephone Encounter (Signed)
I am confused since he was already notified nov 21  See that phone note

## 2012-12-02 NOTE — Telephone Encounter (Signed)
Called the patient and he did get results on 11/20/12 by phone, but wanted a copy of results mailed to him.Derrick Petersen the patient a copy of PFT results as requested in the mail.

## 2012-12-05 ENCOUNTER — Telehealth: Payer: Self-pay | Admitting: Internal Medicine

## 2012-12-05 DIAGNOSIS — R0600 Dyspnea, unspecified: Secondary | ICD-10-CM

## 2012-12-05 NOTE — Telephone Encounter (Signed)
Derrick Petersen , PCP Dr. Jonny Ruiz.  Patient has received his Pulmonary Function Study Results.  He is concerned -About his Diffusion Lung results(lung volume) of 39%.   He as many questions- (1)  Concern that he might have Pulmonary HTN. (2) Is there anything to do to "stabilize his lungs".  (3) Can they do anything to Rehabilitate his lungs?  (4) Is it ok to continue to work out at [employer].  He does experience Shortness of Breath with exertion.  (5) He does have stress study- that Right Ventricle 50-55% pressure (normal is less 30mm).  Tricuspid Valve- possible leak of valve.  This was not discussed with him at the time of review. Test done 2010. (6) Does he need to go back to Cardiology and /or Pulmonlogy? PLEASE CONTACT PATIENT REGARDING STUDY AND QUESTIONS

## 2012-12-05 NOTE — Telephone Encounter (Signed)
All questions would require some time to explain, and since he has symptoms and may need tx improved, I went ahead and referred to pulmonary  (no need to see cardiology, unless felt he needed to do so by the pulmonary)

## 2012-12-05 NOTE — Telephone Encounter (Signed)
Patient informed. 

## 2012-12-17 ENCOUNTER — Encounter: Payer: Self-pay | Admitting: Internal Medicine

## 2012-12-17 ENCOUNTER — Ambulatory Visit (INDEPENDENT_AMBULATORY_CARE_PROVIDER_SITE_OTHER): Payer: Medicare Other | Admitting: Internal Medicine

## 2012-12-17 ENCOUNTER — Ambulatory Visit (INDEPENDENT_AMBULATORY_CARE_PROVIDER_SITE_OTHER)
Admission: RE | Admit: 2012-12-17 | Discharge: 2012-12-17 | Disposition: A | Discharge status: Home / Self Care | Payer: Medicare Other | Source: Ambulatory Visit | Attending: Internal Medicine | Admitting: Internal Medicine

## 2012-12-17 VITALS — BP 134/96 | HR 94 | Temp 97.5°F | Ht 71.0 in | Wt 218.0 lb

## 2012-12-17 DIAGNOSIS — I272 Pulmonary hypertension, unspecified: Secondary | ICD-10-CM

## 2012-12-17 DIAGNOSIS — I2789 Other specified pulmonary heart diseases: Secondary | ICD-10-CM

## 2012-12-17 DIAGNOSIS — I10 Essential (primary) hypertension: Secondary | ICD-10-CM

## 2012-12-17 MED ORDER — OLMESARTAN MEDOXOMIL 20 MG PO TABS: 20.0000 mg | ORAL_TABLET | Freq: Every day | ORAL | Status: DC

## 2012-12-17 NOTE — Progress Notes (Signed)
  Subjective:    Patient ID: Derrick Petersen., male    DOB: [birth year]   MRN: [id number]  HPI  4 yowm retired from [employer] with sob with heavy exertion 1980s and  occ bronchitis but bronchitis resolved after quit smoking in  1990 referred by Dr Jonny Ruiz to pulmonary clinic with ? PF/ [facility]   12/17/2012 1st pulmonary eval cc doe x decades reproduced with gxt 02/14/09 by Torrelli which showed a vo36max of 17  with PAH on echo.  He really is not aerobically acitive in meantime and main resp  complaint is pnds which on ACEI  X  6 months with mostly daytime symptoms and no anterior rhinorrhea excess mucus or assoc itching sneezing wheezing or previous h/o allergic issues or hb.  Sleeping ok without nocturnal  or early am exacerbation  of respiratory  c/o's or need for noct saba. Also denies any obvious fluctuation of symptoms with weather or environmental changes or other aggravating or alleviating factors except as outlined above   Review of Systems  Constitutional: Negative for fever, chills, activity change, appetite change and unexpected weight change.  HENT: Positive for rhinorrhea, dental problem and postnasal drip. Negative for congestion, sore throat, sneezing, trouble swallowing and voice change.   Eyes: Negative for visual disturbance.  Respiratory: Positive for cough and shortness of breath. Negative for choking.   Cardiovascular: Negative for chest pain and leg swelling.  Gastrointestinal: Negative for nausea, vomiting and abdominal pain.  Genitourinary: Negative for difficulty urinating.  Musculoskeletal: Negative for arthralgias.  Skin: Negative for rash.  Psychiatric/Behavioral: Negative for behavioral problems and confusion.       Objective:   Physical Exam  amb wm nad  Wt Readings from Last 3 Encounters:  12/17/12 218 lb (98.884 kg)  08/13/12 220 lb 8 oz (100.018 kg)  08/08/11 219 lb 2 oz (99.394 kg)     Gen: No acute distress. Well nourished and well groomed.   Neurological: Alert and oriented to person, place, and time. Coordination normal.  Head: Normocephalic and atraumatic.  Eyes: Conjunctivae are normal. Pupils are equal, round, and reactive to light. No scleral icterus.  Neck: Normal range of motion. Neck supple. No tracheal deviation or thyromegaly present. No cervical lymphadenopathy.  Cardiovascular: Normal rate, regular rhythm, normal heart sounds and intact distal pulses. Exam reveals no gallop and no friction rub. No murmur heard.  Respiratory: Effort normal. No respiratory distress. No chest wall tenderness. Breath sounds normal. No wheezes, rales or rhonchi.  GI: Soft. Bowel sounds are normal. The abdomen is soft and nontender. There is no rebound and no guarding.  Musculoskeletal: Normal range of motion. Extremities are nontender.  Skin: Skin is warm and dry. No rash noted. No diaphoresis. No erythema. No pallor. No clubbing, cyanosis, or edema.  Psychiatric: Normal mood and affect. Behavior is normal. Judgment and thought content normal.     CXR  12/17/2012 : COPD/chronic changes. No active disease.      Assessment & Plan:

## 2012-12-17 NOTE — Patient Instructions (Addendum)
Please see patient coordinator before you leave today  to schedule 2 d echo  Please remember to go to the xray  department downstairs for your tests - we will call you with the results when they are available.  Stop lisinopril and start benicar 20 mg one daily, reduce by one half if light headed standing  GERD (REFLUX)  is an extremely common cause of respiratory symptoms, many times with no significant heartburn at all.    It can be treated with medication, but also with lifestyle changes including avoidance of late meals, excessive alcohol, smoking cessation, and avoid fatty foods, chocolate, peppermint, colas, red wine, and acidic juices such as orange juice.  NO MINT OR MENTHOL PRODUCTS SO NO COUGH DROPS  USE SUGARLESS CANDY INSTEAD (jolley ranchers or Stover's)  NO OIL BASED VITAMINS - use powdered substitutes.  Please schedule a follow up office visit in 4-6 weeks, call sooner if needed with 6 min walk on return

## 2012-12-18 ENCOUNTER — Telehealth: Payer: Self-pay | Admitting: Internal Medicine

## 2012-12-18 NOTE — Telephone Encounter (Signed)
Spoke with the pt and notified him of results of cxr He verbalized understanding Nothing further needed

## 2012-12-18 NOTE — Progress Notes (Signed)
Quick Note:  Spoke with pt and notified of results per Dr. Wert. Pt verbalized understanding and denied any questions.  ______ 

## 2012-12-22 ENCOUNTER — Ambulatory Visit (HOSPITAL_COMMUNITY): Payer: Medicare Other | Attending: Cardiology

## 2012-12-22 DIAGNOSIS — R0989 Other specified symptoms and signs involving the circulatory and respiratory systems: Secondary | ICD-10-CM | POA: Insufficient documentation

## 2012-12-22 DIAGNOSIS — I272 Pulmonary hypertension, unspecified: Secondary | ICD-10-CM

## 2012-12-22 DIAGNOSIS — R0609 Other forms of dyspnea: Secondary | ICD-10-CM | POA: Insufficient documentation

## 2012-12-22 DIAGNOSIS — I2789 Other specified pulmonary heart diseases: Secondary | ICD-10-CM | POA: Insufficient documentation

## 2012-12-22 DIAGNOSIS — Z87891 Personal history of nicotine dependence: Secondary | ICD-10-CM | POA: Insufficient documentation

## 2012-12-22 DIAGNOSIS — I079 Rheumatic tricuspid valve disease, unspecified: Secondary | ICD-10-CM | POA: Insufficient documentation

## 2012-12-22 NOTE — Progress Notes (Signed)
Echocardiogram performed.  

## 2012-12-23 ENCOUNTER — Encounter: Payer: Self-pay | Admitting: Internal Medicine

## 2012-12-29 ENCOUNTER — Telehealth: Payer: Self-pay | Admitting: Internal Medicine

## 2012-12-29 NOTE — Telephone Encounter (Signed)
Called, spoke with pt.  I informed him of below per RA.  Pt then replied, "I'll probably be dead in 3 days."  Pt states he is more worried about his pulse now.  Pt reports his pulse is usually in the 70's but is now 98.  BP now 191/101.  Pt states his BP has been as high as 201/119 on the Benicar and was always below 140/80 on the lisinopril.  He denies increased SOB, dizziness, light headed feelings, HA, or any change in vision.  Pt reports yesterday he took 30 mg of Benicar hoping this would help.  Today he took Benicar 20 mg.  Pt doesn't believe Benicar is helping at all.  He would like further recs.  Dr. Vassie Loll, pls advise if you have any further recs.  Thank you.

## 2012-12-29 NOTE — Telephone Encounter (Signed)
Spoke with pt He states that since MW changed the lisinopril 10 mg to benicar 20 mg his BP has been running very high It has been as high as 190/110 Pulse rate also increased  He tried to get in to see his PCP, Dr Jonny Ruiz and states that he could not since he was out of the office Would like recs from [facility] but he is off today, so will forward to doc of the day RA, please advise, thanks! No Known Allergies

## 2012-12-29 NOTE — Telephone Encounter (Signed)
Patient Information:  Caller Name: Trygg  Phone: [phone]  Patient: Derrick Petersen, Derrick Petersen  Gender: Male  DOB: 1941/12/06  Age: 71 Years  PCP: Oliver Barre (Adults only)  Office Follow Up:  Does the office need to follow up with this patient?: Yes  Instructions For The Office: please follow up with patient  RN Note:  pt is going to call Dr Sherene Sires also to inform him of elevated BP readings to see if he needs to change back to Lisinopril or continue with Benicar.  Pt denied any symptoms of headache, nosebleeds, chestpain or dizziness.  PT is requesting call back from Dr Jonny Ruiz or Dr Melvyn Novas nurse today  Symptoms  Reason For Call & Symptoms: pt reports that he saw his pulmonary doctor on 12/18 and that doctor (wert) changed his BP medication from Lisinopril to Benicar.  Pt states that since he has switched the medications his BP is more elevated at 198/119 and his pulse is more elevated at 87.  Pt has been on the new medication since 12/18/12.  Pt is wanting to know what he needs to do.  Reviewed Health History In EMR: Yes  Reviewed Medications In EMR: Yes  Reviewed Allergies In EMR: Yes  Reviewed Surgeries / Procedures: Yes  Date of Onset of Symptoms: 12/29/2012  Guideline(s) Used:  High Blood Pressure  Disposition Per Guideline:   See Today in Office  Reason For Disposition Reached:   BP > 180/110  Advice Given:  N/A  Patient Refused Recommendation:  Patient Will Follow Up With Office Later  pt would like a call back from Dr Melvyn Novas nurse if she is available.  RN informed pt that Dr Jonny Ruiz was not in the office until Thursday, 1/2

## 2012-12-29 NOTE — Telephone Encounter (Signed)
He needs to be seen

## 2012-12-29 NOTE — Telephone Encounter (Signed)
Called, spoke with pt.  Informed him RA recs he resume the lisinopril and have OV soon.  We have scheduled him to see TP tomorrow, Dec 31 at 10 am.  Pt aware and was advised to seek emergency care if symptoms worsen.  He verbalized understanding and voiced no further questions or concerns at this time.

## 2012-12-29 NOTE — Telephone Encounter (Signed)
Can increase benicar to 40 daily (2 tabs) & call back with Bp readings in 3 ds

## 2012-12-29 NOTE — Telephone Encounter (Signed)
Patient informed transferred to scheduler.

## 2012-12-29 NOTE — Telephone Encounter (Signed)
Pl arrange OV with MW/ TP soon please for Bp rechk

## 2012-12-29 NOTE — Telephone Encounter (Signed)
In that case resume lisinopril, until Dr wert can get back to him with recommendations

## 2012-12-30 ENCOUNTER — Encounter: Payer: Self-pay | Admitting: Adult Health

## 2012-12-30 ENCOUNTER — Ambulatory Visit (INDEPENDENT_AMBULATORY_CARE_PROVIDER_SITE_OTHER): Payer: Medicare Other | Admitting: Adult Health

## 2012-12-30 VITALS — BP 144/78 | HR 82 | Temp 98.0°F | Ht 71.0 in | Wt 218.2 lb

## 2012-12-30 DIAGNOSIS — I272 Pulmonary hypertension, unspecified: Secondary | ICD-10-CM

## 2012-12-30 DIAGNOSIS — I2789 Other specified pulmonary heart diseases: Secondary | ICD-10-CM

## 2012-12-30 NOTE — Addendum Note (Signed)
Addended by: Boone Master E on: 12/30/2012 01:10 PM   Modules accepted: Orders

## 2012-12-30 NOTE — Progress Notes (Signed)
Subjective:    Patient ID: Derrick Sargeant Jr., male    DOB: 03/03/1941   MRN: [id number]  HPI  71 yowm retired from [employer] with sob with heavy exertion 1980s and  occ bronchitis but bronchitis resolved after quit smoking in  1990 referred by Dr John to pulmonary clinic with ? PF/ PAH   12/17/2012 1st pulmonary eval cc doe x decades reproduced with gxt 02/14/09 by Torrelli which showed a vo2max of 17  with PAH on echo.  He really is not aerobically acitive in meantim and   main complaint is pnds which on ACEI  x 6 months with mostly daytime symptoms and no anterior rhinorrhea or assoc itching sneezing wheezing or previous h/o allergic issues or hb.  >d/c ACE >change to Benicar   12/30/2012 Acute OV  Complains of elevated b/p on Benicar.. patient was taken off of lisinopril at last visit Blood pressures have been averaging anywhere between 160 and 190 systolic He did restart lisinopril. This morning, and blood pressure has decreased Initially, he was only taking Benicar 10 mg. He increase Benicar to 20 mg over last few days without significant improvement in blood pressure control Patient was seen 2 weeks ago for pulmonary consult for possible pulmonary hypertension with significant dyspnea on exertion. He had underwent a pulmonary function test that revealed a normal. FEV1, DLCO at 39% He was set up for a 2-D echo that showed normal left ventricular function with an EF at  55-65%, pulmonary artery peak pressure was 68 He has never had a sleep study. Does have some mild daytime hypersomnolence Chest x-ray showed some COPD changes. 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  HENT:   Negative for congestion, sore throat, sneezing, trouble swallowing and voice change.   Eyes: Negative for visual disturbance.  Respiratory: Positive  shortness of breath. Negative for choking.   Cardiovascular: Negative for chest pain and leg swelling.  Gastrointestinal: Negative for nausea, vomiting and abdominal pain.  Genitourinary: Negative for difficulty urinating.  Musculoskeletal: Negative for arthralgias.  Skin: Negative for rash.  Psychiatric/Behavioral: Negative for behavioral problems and confusion.       Objective:   Physical Exam  amb  WM , NAD   Gen: No acute distress. Well nourished and well groomed.  Neurological: Alert and oriented to person, place, and time. Coordination normal.  Head: Normocephalic and atraumatic.  Eyes: Conjunctivae are normal. Pupils are equal, round, and reactive to light. No scleral icterus.  Neck: Normal range of motion. Neck supple. No tracheal deviation or thyromegaly present. No cervical lymphadenopathy.  Cardiovascular: Normal rate, regular rhythm, normal heart sounds and intact distal pulses. Exam reveals no gallop and no friction rub. No murmur heard.  Respiratory: Effort normal. No respiratory distress. No chest wall tenderness. Breath sounds normal. No wheezes, rales or rhonchi.  GI: Soft. Bowel sounds are normal. The abdomen is soft and nontender. There is no rebound and no guarding.  Musculoskeletal: Normal range of motion. Extremities are nontender.  Skin: Skin is warm and dry. No rash noted. No diaphoresis. No erythema. No pallor. No clubbing, cyanosis, or edema.  Psychiatric: Normal mood and affect. Behavior is normal. Judgment and thought content normal.       CXR  12/17/2012 : COPD/chronic changes. No active disease.      Assessment & Plan:   

## 2012-12-30 NOTE — Patient Instructions (Addendum)
We are setting you up for a Overnight oximetry test .  Increase Benicar 40mg  daily  Low salt diet  Call if b/p >150  Return for follow up in 3 weeks as planned with 6 min walk Please contact office for sooner follow up if symptoms do not improve or worsen or seek emergency care

## 2012-12-30 NOTE — Assessment & Plan Note (Addendum)
Dyspnea w/ decreased DLCO on PFTs  Patient will need further workup would begin with a overnight oximetry test. He will return for a 6 minute walk test, and we'll decide on further testing on followup visit B/p not at goal on Benicar  For, now, we'll try off of ACE inhibitor, however, if unable to control blood pressure will need to restart lisinopril until seen back with Dr. Sherene Sires. On return visit  Plan  Set up for ONO  Adjust b/p rx as not at goal     Increase Benicar 40mg  daily  Low salt diet  Call if b/p >150  Return for follow up in 3 weeks as planned with 6 min walk Please contact office for sooner follow up if symptoms do not improve or worsen or seek emergency care

## 2012-12-31 ENCOUNTER — Encounter: Payer: Self-pay | Admitting: Internal Medicine

## 2012-12-31 NOTE — Assessment & Plan Note (Addendum)
ACE inhibitors are problematic in  pts with airway complaints because  even experienced pulmonologists can't always distinguish ace effects from copd/asthma.  By themselves they don't actually cause a problem, much like oxygen can't by itself start a fire, but they certainly serve as a powerful catalyst or enhancer for any "fire"  or inflammatory process in the upper airway, be it caused by an ET  tube or more commonly reflux (especially in the obese or pts with known GERD or who are on biphoshonates).    In the era of ARB near equivalency until we have a better handle on the reversibility of the airway problem, it just makes sense to avoid ACEI  entirely in the short run and then decide later, having established a level of airway control using a reasonable limited regimen, whether to add back ace but even then being very careful to observe the pt for worsening airway control and number of meds used/ needed to control symptoms.    In this case his only detectable resp symptoms are upper airway in nature ? acei or non-specific pnds > trial off acei vs GERD effectand on Benicar titrated up or down if needed.

## 2012-12-31 NOTE — Assessment & Plan Note (Signed)
Clinically has more upper airway symptoms (? acei related?) than anything attributable to pulmonary vasc dz but this may be due to sedentary lifestyle so need to complete the w/u with ono on RAand return for fresh 6 min walk/ PFT's/Echo then consider further w/u at that point.

## 2013-01-01 ENCOUNTER — Encounter: Payer: Self-pay | Admitting: Internal Medicine

## 2013-01-01 ENCOUNTER — Ambulatory Visit (INDEPENDENT_AMBULATORY_CARE_PROVIDER_SITE_OTHER): Payer: 59 | Admitting: Internal Medicine

## 2013-01-01 ENCOUNTER — Other Ambulatory Visit (INDEPENDENT_AMBULATORY_CARE_PROVIDER_SITE_OTHER): Payer: 59

## 2013-01-01 VITALS — BP 192/82 | HR 90 | Temp 97.1°F | Ht 71.0 in | Wt 219.5 lb

## 2013-01-01 DIAGNOSIS — I10 Essential (primary) hypertension: Secondary | ICD-10-CM | POA: Insufficient documentation

## 2013-01-01 MED ORDER — HYDROCHLOROTHIAZIDE 25 MG PO TABS: 25.0000 mg | ORAL_TABLET | Freq: Every day | ORAL | Status: DC

## 2013-01-01 NOTE — Assessment & Plan Note (Addendum)
Uncontrolled but o/w stable overall by hx and exam, most recent data reviewed with pt, and pt to continue medical treatment as before BP Readings from Last 3 Encounters:  01/01/13 192/82  12/30/12 144/78  12/17/12 134/96   Suspect recent elevation most likely related to rather severe dietary indiscretion with salt (though no edema or wt gain) ; ok to cont lisinopril 10 qd (to keep off benicar for now though not clear his various pains were related) and add HCTZ 25 qd at least temporarily; also to look into secondary cause with renal artery u/s, and r/o hyperaldo with lab; does have f/u appt with Dr Wert jan 17 who has been involved in my recent abscence, to cont to montior BP at home, strict low salt diet, and f/u here as well

## 2013-01-01 NOTE — Patient Instructions (Addendum)
Please take the fluid pill as directed Continue all other medications as before, including the lisinopril at 10 mg You will be contacted regarding the referral for: renal ultrasound Please go to LAB in the Basement for the blood and/or urine tests to be done today You will be contacted by phone if any changes need to be made immediately.  Otherwise, you will receive a letter about your results with an explanation, but please check with MyChart first. Thank you for enrolling in MyChart. Please follow the instructions below to securely access your online medical record. MyChart allows you to send messages to your doctor, view your test results, renew your prescriptions, schedule appointments, and more. Please keep your appointments with your specialists as you have planned - Jan 17 with Dr Sherene Sires

## 2013-01-01 NOTE — Progress Notes (Signed)
Subjective:    Patient ID: Derrick Petersen., male    DOB: [birth year], 72 y.o.   MRN: [id number]  HPI  Here to f/u; c/o persistent recurring elev BP in the past 4-6 wks; readily admits to large dietary indiscretion with salt over the holidays almost on a daily basis for several wks, primarily salted crackers and cheese at parties, and his beloved chicken noodle canned soups; prior to the diet change had been readily controlled with lisinopril 10 mg; since then BP has been variable but essentially not responded to change to benicar with frequent home BP readings (seemed actually worse to him, admits to some stress over the situation, and c/o various aches to multiple areas of the body with taking this, resolved since going back to the lisinopril in the past 2 days); Pt denies chest pain, increased sob or doe, wheezing, orthopnea, PND, increased LE swelling, palpitations, dizziness or syncope.  Wt essentially stable.  Pt denies new neurological symptoms such as new headache, or facial or extremity weakness or numbness   Pt denies polydipsia, polyuria.  Recent aug 2013 labs with ? Slightly elev K (not low), recent cxr per Dr Sherene Sires with copd/chronic changes, and reduced DLCO on PFT's of unclear signficance, has f/u appt with Dr Wert jan 17 Past Medical History  Diagnosis Date  . COLONIC POLYPS, HX OF 07/26/2009  . FATIGUE 07/26/2009  . GLUCOSE INTOLERANCE 07/26/2009  . HYPERLIPIDEMIA 07/26/2009  . HYPERTENSION 07/26/2009  . OTITIS EXTERNA 11/28/2009  . Impaired glucose tolerance 08/05/2011  . Polyneuropathy 09/12/2012    Mild, length dependent axonal sensorimotor polyneuropathy by NCS/EMG - sept 2013; Dr Yan/[facility]   No past surgical history on file.  reports that he quit smoking about 24 years ago. His smoking use included Cigarettes. He has a 102 pack-year smoking history. He has never used smokeless tobacco. He reports that he drinks about 1.8 ounces of alcohol per week. He reports that he does  not use illicit drugs. family history includes Colon cancer in his father; Diabetes in his other; and Hypertension in his father and mother. No Known Allergies Current Outpatient Prescriptions on File Prior to Visit  Medication Sig Dispense Refill  . aspirin 81 MG tablet 2 tabs by mouth once daily  90 tablet  3  . B Complex-C (SUPER B COMPLEX PO) Take by mouth daily.        . cholecalciferol (VITAMIN D) 1000 UNITS tablet 2 by mouth once daily       . Coenzyme Q10 (COQ10 PO) Take by mouth daily.      . ferrous gluconate (FERGON) 325 MG tablet Take 325 mg by mouth daily.        Marland Kitchen GARLIC PO Take by mouth daily.      Marland Kitchen glucosamine-chondroitin 500-400 MG tablet Take 1 tablet by mouth daily.      Marland Kitchen L-Lysine 1000 MG TABS Take 1 tablet by mouth daily.      . Magnesium 300 MG CAPS Take by mouth daily.        . Multiple Vitamin (MULTIVITAMIN) capsule Take 1 capsule by mouth daily.        . niacin (NIASPAN) 500 MG CR tablet Take 1 tablet (500 mg total) by mouth at bedtime.  30 tablet  3  . Selenium 200 MCG TABS Take 1 tablet by mouth daily.      . vitamin E 400 UNIT capsule Take 400 Units by mouth daily.      . hydrochlorothiazide (HYDRODIURIL)  25 MG tablet Take 1 tablet (25 mg total) by mouth daily.  90 tablet  3   Review of Systems  Constitutional: Negative for diaphoresis and unexpected weight change.  HENT: Negative for tinnitus.   Eyes: Negative for photophobia and visual disturbance.  Respiratory: Negative for choking and stridor.   Gastrointestinal: Negative for vomiting and blood in stool.  Genitourinary: Negative for hematuria and decreased urine volume.  Musculoskeletal: Negative for gait problem.  Skin: Negative for color change and wound.  Neurological: Negative for tremors and numbness.  Psychiatric/Behavioral: Negative for decreased concentration. The patient is not hyperactive.       Objective:   Physical Exam BP 192/82  Pulse 90  Temp 97.1 F (36.2 C) (Oral)  Ht 5\' 11"   (1.803 m)  Wt 219 lb 8 oz (99.565 kg)  BMI 30.61 kg/m2  SpO2 92% Physical Exam  VS noted Constitutional: Pt appears well-developed and well-nourished.  HENT: Head: Normocephalic.  Right Ear: External ear normal.  Left Ear: External ear normal.  Eyes: Conjunctivae and EOM are normal. Pupils are equal, round, and reactive to light.  Neck: Normal range of motion. Neck supple.  Cardiovascular: Normal rate and regular rhythm.   Pulmonary/Chest: Effort normal and breath sounds normal.  Neurological: Pt is alert. Not confused  Skin: Skin is warm. No erythema.  Psychiatric: Pt behavior is normal. Thought content normal. mild nervous    Assessment & Plan:

## 2013-01-02 LAB — CBC WITH DIFFERENTIAL/PLATELET
Basophils Absolute: 0 10*3/uL (ref 0.0–0.1)
Eosinophils Relative: 2.7 % (ref 0.0–5.0)
HCT: 45 % (ref 39.0–52.0)
Lymphs Abs: 3 10*3/uL (ref 0.7–4.0)
MCV: 92.5 fl (ref 78.0–100.0)
Monocytes Absolute: 0.9 10*3/uL (ref 0.1–1.0)
Neutrophils Relative %: 65.7 % (ref 43.0–77.0)
Platelets: 259 10*3/uL (ref 150.0–400.0)
RDW: 11.9 % (ref 11.5–14.6)
WBC: 12.4 10*3/uL — ABNORMAL HIGH (ref 4.5–10.5)

## 2013-01-02 LAB — URINALYSIS, ROUTINE W REFLEX MICROSCOPIC
Bilirubin Urine: NEGATIVE
Hgb urine dipstick: NEGATIVE
Leukocytes, UA: NEGATIVE
Nitrite: NEGATIVE
Total Protein, Urine: NEGATIVE
Urobilinogen, UA: 0.2 (ref 0.0–1.0)

## 2013-01-02 LAB — BASIC METABOLIC PANEL
BUN: 19 mg/dL (ref 6–23)
Creatinine, Ser: 1.3 mg/dL (ref 0.4–1.5)
GFR: 58.93 mL/min — ABNORMAL LOW (ref >60.00)
Glucose, Bld: 93 mg/dL (ref 70–99)
Potassium: 5.2 mEq/L — ABNORMAL HIGH (ref 3.5–5.1)

## 2013-01-05 ENCOUNTER — Ambulatory Visit
Admission: RE | Admit: 2013-01-05 | Discharge: 2013-01-05 | Disposition: A | Discharge status: Home / Self Care | Payer: 59 | Source: Ambulatory Visit | Attending: Internal Medicine | Admitting: Internal Medicine

## 2013-01-05 DIAGNOSIS — I10 Essential (primary) hypertension: Secondary | ICD-10-CM

## 2013-01-15 ENCOUNTER — Telehealth: Payer: Self-pay | Admitting: Adult Health

## 2013-01-15 ENCOUNTER — Encounter: Payer: Self-pay | Admitting: Adult Health

## 2013-01-15 ENCOUNTER — Encounter: Payer: Self-pay | Admitting: *Deleted

## 2013-01-15 DIAGNOSIS — I272 Pulmonary hypertension, unspecified: Secondary | ICD-10-CM

## 2013-01-15 NOTE — Telephone Encounter (Signed)
Returning call can be reached at [phone].Derrick Petersen

## 2013-01-15 NOTE — Telephone Encounter (Signed)
Pt returned Derrick Petersen call & asked to be reached at [phone].  Derrick Petersen

## 2013-01-15 NOTE — Telephone Encounter (Signed)
LMOM TCB x1.  Did inform pt that this can be discussed with MW at [facility] tomorrow if we do not speak and that a mychart message has be sent as well.  Will continue to hold in my inbasket until can verify that pt is aware of new O2 start.

## 2013-01-15 NOTE — Telephone Encounter (Signed)
[facility] message was sent to patient explaining ONO results/recs as stated by TP.  Pt does have ov w/ MW 1.17.14 w/ PFT prior.  Results/recs to be discussed in detail at ov.  LMOM TCB x1 to inform pt of email.  Order for new O2 start sent to [facility] - dme [facility].

## 2013-01-16 ENCOUNTER — Encounter (INDEPENDENT_AMBULATORY_CARE_PROVIDER_SITE_OTHER): Payer: Medicare Other | Admitting: Internal Medicine

## 2013-01-16 ENCOUNTER — Encounter: Payer: Self-pay | Admitting: Internal Medicine

## 2013-01-16 ENCOUNTER — Ambulatory Visit (INDEPENDENT_AMBULATORY_CARE_PROVIDER_SITE_OTHER): Payer: Medicare Other | Admitting: Internal Medicine

## 2013-01-16 VITALS — BP 156/76 | HR 96 | Temp 97.6°F | Ht 71.0 in | Wt 215.2 lb

## 2013-01-16 DIAGNOSIS — I272 Pulmonary hypertension, unspecified: Secondary | ICD-10-CM

## 2013-01-16 DIAGNOSIS — R0600 Dyspnea, unspecified: Secondary | ICD-10-CM

## 2013-01-16 DIAGNOSIS — R0609 Other forms of dyspnea: Secondary | ICD-10-CM

## 2013-01-16 DIAGNOSIS — I2789 Other specified pulmonary heart diseases: Secondary | ICD-10-CM

## 2013-01-16 DIAGNOSIS — I10 Essential (primary) hypertension: Secondary | ICD-10-CM

## 2013-01-16 NOTE — Progress Notes (Signed)
Subjective:    Patient ID: Derrick Petersen., male    DOB: November 08, 1941   MRN: [id number]  HPI  42 yowm retired from [employer] with sob with heavy exertion 1980s and  occ bronchitis but bronchitis resolved after quit smoking in  1990 referred by Dr Jonny Ruiz to pulmonary clinic with ? PF/ [facility]   12/17/2012 1st pulmonary eval cc doe x decades reproduced with gxt 02/14/09 by Torrelli which showed a vo76max of 17  with PAH on echo.  He really is not aerobically acitive in meantime  main complaint is pnds which on ACEI  x 6 months with mostly daytime symptoms and no anterior rhinorrhea or assoc itching sneezing wheezing or previous h/o allergic issues or hb.  >d/c ACE >change to Benicar   12/30/2012 Acute OV  Complains of elevated b/p on Benicar.. patient was taken off of lisinopril at last visit Blood pressures have been averaging anywhere between 160 and 190 systolic He did restart lisinopril. This morning, and blood pressure has decreased Initially, he was only taking Benicar 10 mg. He increase Benicar to 20 mg over last few days without significant improvement in blood pressure control Patient was seen 2 weeks ago for pulmonary consult for possible pulmonary hypertension with significant dyspnea on exertion. He had underwent a pulmonary function test that revealed a normal. FEV1, DLCO at 39% He was set up for a 2-D echo that showed normal left ventricular function with an EF at  55-65%, pulmonary artery peak pressure was 68 He has never had a sleep study. Does have some mild daytime hypersomnolence Chest x-ray showed some COPD changes. He was a former smoker, quit approximately 25 years ago Worked previously for the health department with intermittent chemical exposures   01/16/2013 f/u ov/Cathrine Krizan cc no change mild doe, most concerned about hbp on arb.  No ha, tia symptoms.  No obvious daytime variabilty or assoc chronic cough or cp or chest tightness, subjective wheeze overt sinus or hb symptoms. No  unusual exp hx or h/o childhood pna/ asthma or premature birth to his knowledge.   Sleeping ok without nocturnal  or early am exacerbation  of respiratory  c/o's or need for noct saba. Also denies any obvious fluctuation of symptoms with weather or environmental changes or other aggravating or alleviating factors except as outlined above   ROS  The following are not active complaints unless bolded sore throat, dysphagia, dental problems, itching, sneezing,  nasal congestion or excess/ purulent secretions, ear ache,   fever, chills, sweats, unintended wt loss, pleuritic or exertional cp, hemoptysis,  orthopnea pnd or leg swelling, presyncope, palpitations, heartburn, abdominal pain, anorexia, nausea, vomiting, diarrhea  or change in bowel or urinary habits, change in stools or urine, dysuria,hematuria,  rash, arthralgias, visual complaints, headache, numbness weakness or ataxia or problems with walking or coordination,  change in mood/affect or memory.            Objective:   Physical Exam  amb  WM , NAD   Gen: No acute distress. Well nourished and well groomed.  Neurological: Alert and oriented to person, place, and time. Coordination normal.  Head: Normocephalic and atraumatic.  Eyes: Conjunctivae are normal. Pupils are equal, round, and reactive to light. No scleral icterus.  Neck: Normal range of motion. Neck supple. No tracheal deviation or thyromegaly present. No cervical lymphadenopathy.  Cardiovascular: Normal rate, regular rhythm, normal heart sounds and intact distal pulses. Exam reveals no gallop and no friction rub. No murmur heard.  Respiratory:  Effort normal. No respiratory distress. No chest wall tenderness. Breath sounds normal. No wheezes, rales or rhonchi.  GI: Soft. Bowel sounds are normal. The abdomen is soft and nontender. There is no rebound and no guarding.  Musculoskeletal: Normal range of motion. Extremities are nontender.  Skin: Skin is warm and dry. No rash noted.  No diaphoresis. No erythema. No pallor. No clubbing, cyanosis, or edema.  Psychiatric: Normal mood and affect. Behavior is normal. Judgment and thought content normal.     CXR  12/17/2012 : COPD/chronic changes. No active disease.      Assessment & Plan:

## 2013-01-16 NOTE — Patient Instructions (Addendum)
Please see patient coordinator before you leave today  to schedule vq scan and repeat overnight study while on 2lpm (target is to keep above 88% - ok to be lower transiently but for no more than 5 min total)  Please schedule a follow up office visit in 6 weeks, call sooner if needed with Dr Delton Coombes

## 2013-01-19 NOTE — Telephone Encounter (Signed)
ONO results / recs discussed with patient at 1.17.14 ov w/ MW; O2 qhs order faxed to [facility] by [facility].  Will sign off and close the [place] message as well.

## 2013-01-19 NOTE — Progress Notes (Signed)
 This encounter was created in error - please disregard.

## 2013-01-19 NOTE — Assessment & Plan Note (Signed)
Trial off ACEI for pnds started 12/17/12  > not able to complete due to labile hypertension  No active cough or pseudowheeze but if doe worsens s other obvious clinical changes related to [facility] I would have a low threshold to change to alternative rx noting he has not had an adequate trial off acei at this point to determine which if any of his symptoms are attributable to acei

## 2013-01-19 NOTE — Assessment & Plan Note (Addendum)
-   11/13/12 PFT's VC 100%, DLCO 39% - Echo 12/22/12 >  PAS 68 -ONO 12/30/2012 >>begin O2 Qhs 2 l/m 01/15/2013 > repeat on 2lpm then consider sleep study -6 min walk 01/17/12 435 meters and no desat -V/Q ordered 01/16/13 >>>  Not clear if this is primary or secondary PAH > will complete the prelminary w/u then refer to Dr Delton Coombes to consider treatment options

## 2013-01-20 ENCOUNTER — Encounter (HOSPITAL_COMMUNITY): Payer: Medicare Other

## 2013-02-09 ENCOUNTER — Encounter: Payer: Self-pay | Admitting: Internal Medicine

## 2013-02-17 ENCOUNTER — Telehealth: Payer: Self-pay | Admitting: Internal Medicine

## 2013-02-17 NOTE — Telephone Encounter (Signed)
Per MW- ONO on 2lpm looks good and he should stay on the 2lpm with sleep Spoke with pt and notified of results per Dr. Sherene Sires. Pt verbalized understanding and denied any questions.

## 2013-03-02 ENCOUNTER — Institutional Professional Consult (permissible substitution): Payer: Medicare Other | Admitting: Emergency Medicine

## 2013-03-03 ENCOUNTER — Encounter: Payer: Self-pay | Admitting: Internal Medicine

## 2013-03-26 ENCOUNTER — Encounter: Payer: Self-pay | Admitting: Emergency Medicine

## 2013-03-26 ENCOUNTER — Ambulatory Visit (INDEPENDENT_AMBULATORY_CARE_PROVIDER_SITE_OTHER): Payer: Medicare Other | Admitting: Emergency Medicine

## 2013-03-26 VITALS — BP 160/88 | HR 89 | Temp 97.4°F | Ht 71.0 in | Wt 219.2 lb

## 2013-03-26 DIAGNOSIS — I2789 Other specified pulmonary heart diseases: Secondary | ICD-10-CM

## 2013-03-26 DIAGNOSIS — I272 Pulmonary hypertension, unspecified: Secondary | ICD-10-CM

## 2013-03-26 NOTE — Assessment & Plan Note (Signed)
-   at this point we have decided to get his BP under control and then repeat his TTE 6 months.  - defer other w/u for now, but strongly consider V/Q, PSG, auto-immune w/u.  - rov after TTE

## 2013-03-26 NOTE — Progress Notes (Signed)
Subjective:    Patient ID: Derrick Petersen., male    DOB: 1941-03-20, 72 y.o.   MRN: [id number]  HPI 72 yo man, hx HTN, polyneuropathy, hyperglycemia.  Former smoker, hx OSA (? CPAP) w slowly progressive dyspnea. Referred for PAH eval. Dr Sherene Sires changed him from lisinopril to benicar, had trouble with this > HTN and some CP. Now back on lisinopril. He is very active, rides stationary bike.   6 min walk 01/16/13 >> no desat, 424m TTE 11/2012 >> PASP ~ 68.  He underwent a pulmonary function test that revealed mild AFL with normal FEV1, DLCO at 39%  V/Q scan was discussed but never done.  ONO showed desats > now on O2 at night PSG not done   Review of Systems  Constitutional: Negative for fever and unexpected weight change.  HENT: Positive for dental problem. Negative for ear pain, nosebleeds, congestion, sore throat, rhinorrhea, sneezing, trouble swallowing, postnasal drip and sinus pressure.   Eyes: Negative for redness and itching.  Respiratory: Positive for shortness of breath. Negative for cough, chest tightness and wheezing.   Cardiovascular: Negative for palpitations and leg swelling.  Gastrointestinal: Negative for nausea and vomiting.  Genitourinary: Negative for dysuria.  Musculoskeletal: Negative for joint swelling.  Skin: Negative for rash.  Neurological: Negative for headaches.  Hematological: Does not bruise/bleed easily.  Psychiatric/Behavioral: Negative for dysphoric mood. The patient is not nervous/anxious.    Past Medical History  Diagnosis Date  . COLONIC POLYPS, HX OF 07/26/2009  . FATIGUE 07/26/2009  . GLUCOSE INTOLERANCE 07/26/2009  . HYPERLIPIDEMIA 07/26/2009  . HYPERTENSION 07/26/2009  . OTITIS EXTERNA 11/28/2009  . Impaired glucose tolerance 08/05/2011  . Polyneuropathy 09/12/2012    Mild, length dependent axonal sensorimotor polyneuropathy by NCS/EMG - sept 2013; Dr Yan/[facility]     Family History  Problem Relation Age of Onset  . Hypertension Mother    . Diabetes Other   . Colon cancer Father   . Hypertension Father      History   Social History  . Marital Status: Single    Spouse Name: N/A    Number of Children: 0  . Years of Education: N/A   Occupational History  . retired [employer].     Social History Main Topics  . Smoking status: Former Smoker -- 3.00 packs/day for 34 years    Types: Cigarettes    Quit date: 12/31/1988  . Smokeless tobacco: Never Used  . Alcohol Use: 1.8 oz/week    3 Cans of beer per week     Comment: occasional  . Drug Use: No  . Sexually Active: Not on file   Other Topics Concern  . Not on file   Social History Narrative   Hobby computer light wts.     No Known Allergies   Outpatient Prescriptions Prior to Visit  Medication Sig Dispense Refill  . aspirin 81 MG tablet 2 tabs by mouth once daily  90 tablet  3  . B Complex-C (SUPER B COMPLEX PO) Take by mouth daily.        . ferrous gluconate (FERGON) 325 MG tablet Take 325 mg by mouth daily as needed.       Marland Kitchen glucosamine-chondroitin 500-400 MG tablet Take 1 tablet by mouth daily.      . hydrochlorothiazide (HYDRODIURIL) 25 MG tablet Take 1 tablet (25 mg total) by mouth daily.  90 tablet  3  . lisinopril (PRINIVIL,ZESTRIL) 10 MG tablet Take 20 mg by mouth daily.       Marland Kitchen  Magnesium 300 MG CAPS Take by mouth daily.        . Multiple Vitamin (MULTIVITAMIN) capsule Take 1 capsule by mouth daily.        . Selenium 200 MCG TABS Take 1 tablet by mouth daily.      . niacin (NIASPAN) 500 MG CR tablet Take 1 tablet (500 mg total) by mouth at bedtime.  30 tablet  3   No facility-administered medications prior to visit.       Objective:   Physical Exam Filed Vitals:   03/26/13 1134  BP: 160/88  Pulse: 89  Temp: 97.4 F (36.3 C)  TempSrc: Oral  Height: 5\' 11"  (1.803 m)  Weight: 219 lb 3.2 oz (99.428 kg)  SpO2: 94%   Gen: Pleasant, well-nourished, in no distress,  normal affect  ENT: No lesions,  mouth clear,  oropharynx  clear, no postnasal drip  Neck: No JVD, no TMG, no carotid bruits  Lungs: No use of accessory muscles, no dullness to percussion, clear without rales or rhonchi  Cardiovascular: RRR, heart sounds normal, no murmur or gallops, no peripheral edema  Musculoskeletal: No deformities, no cyanosis or clubbing  Neuro: alert, non focal  Skin: Warm, no lesions or rashes     Assessment & Plan:  Pulmonary hypertension - at this point we have decided to get his BP under control and then repeat his TTE 6 months.  - defer other w/u for now, but strongly consider V/Q, PSG, auto-immune w/u.  - rov after TTE

## 2013-03-26 NOTE — Patient Instructions (Addendum)
We will perform an echocardiogram in 6 months Follow with Dr Delton Coombes in 6 months after the echocardiogram to discuss the results and plan further evaluation

## 2013-04-27 ENCOUNTER — Telehealth: Payer: Self-pay | Admitting: Internal Medicine

## 2013-04-27 MED ORDER — LISINOPRIL 20 MG PO TABS: 20.0000 mg | ORAL_TABLET | Freq: Every day | ORAL | Status: DC

## 2013-04-27 NOTE — Telephone Encounter (Signed)
Patient Information:  Caller Name: Derrick Petersen  Phone: [phone]  Patient: Derrick Petersen, Derrick Petersen  Gender: Male  DOB: [birth year]  Age: 72 Years  PCP: Oliver Barre (Adults only)  Office Follow Up:  Does the office need to follow up with this patient?: Yes  Instructions For The Office: Refill on Lisiniopril - pt requesting 20mg  tablets  RN Note:  Triage RN reviewed in EPIC-  Lisinopril 10mg  tablets take 20mg  daily ordered by historical provider- explained to caller that it is noted that he should be taking 20mg  daily as noted in chart- pt is requesting a refill on Lisinopril and is requesting 20mg  tablets.  Instructed pt that message will be sent for refill and he can check back at pharmacy later today. will comply  Symptoms  Reason For Call & Symptoms: Pt is calling and states that he needs a refill on Lisinopril; and is requesting Lisinopril to be changed from 10mg  to 20mg  daily; pt started on Lisinopril  10mg  and HCTZ 25mg   in January; pt started taking Lisinopril 20mg  daily on his own without MD approval in mid January;  BP was running 170/60-70  on Lisinopril 10mg  and after increasing Lisinopril  to 20mg  BP is 133-140/66-76; pt is requesting a refill on Lisinopril 20mg  and not the 10mg  that MD  ordered; [facility] [phone]; pt  requesting a refill on Lisinopril  Reviewed Health History In EMR: Yes  Reviewed Medications In EMR: Yes  Reviewed Allergies In EMR: Yes  Reviewed Surgeries / Procedures: Yes  Date of Onset of Symptoms: Unknown  Guideline(s) Used:  No Protocol Available - Information Only  Disposition Per Guideline:   Discuss with PCP and Callback by Nurse Today  Reason For Disposition Reached:   Nursing judgment  Advice Given:  N/A  Patient Will Follow Care Advice:  YES

## 2013-04-27 NOTE — Telephone Encounter (Signed)
rx changed  Please ask pt to return early July for f/u

## 2013-04-27 NOTE — Telephone Encounter (Signed)
Patient informed. 

## 2013-08-17 ENCOUNTER — Encounter: Payer: Self-pay | Admitting: Internal Medicine

## 2013-08-17 ENCOUNTER — Ambulatory Visit (INDEPENDENT_AMBULATORY_CARE_PROVIDER_SITE_OTHER): Payer: Medicare Other

## 2013-08-17 ENCOUNTER — Ambulatory Visit (INDEPENDENT_AMBULATORY_CARE_PROVIDER_SITE_OTHER): Payer: Medicare Other | Admitting: Internal Medicine

## 2013-08-17 VITALS — BP 140/72 | HR 72 | Temp 98.0°F | Wt 217.8 lb

## 2013-08-17 DIAGNOSIS — R7302 Impaired glucose tolerance (oral): Secondary | ICD-10-CM

## 2013-08-17 DIAGNOSIS — R7309 Other abnormal glucose: Secondary | ICD-10-CM

## 2013-08-17 DIAGNOSIS — G629 Polyneuropathy, unspecified: Secondary | ICD-10-CM

## 2013-08-17 DIAGNOSIS — I10 Essential (primary) hypertension: Secondary | ICD-10-CM

## 2013-08-17 DIAGNOSIS — Z0000 Encounter for general adult medical examination without abnormal findings: Secondary | ICD-10-CM

## 2013-08-17 DIAGNOSIS — N320 Bladder-neck obstruction: Secondary | ICD-10-CM

## 2013-08-17 DIAGNOSIS — E785 Hyperlipidemia, unspecified: Secondary | ICD-10-CM

## 2013-08-17 DIAGNOSIS — G609 Hereditary and idiopathic neuropathy, unspecified: Secondary | ICD-10-CM

## 2013-08-17 LAB — CBC WITH DIFFERENTIAL/PLATELET
Eosinophils Relative: 3.1 % (ref 0.0–5.0)
HCT: 45.9 % (ref 39.0–52.0)
Hemoglobin: 16.1 g/dL (ref 13.0–17.0)
Lymphs Abs: 3.7 10*3/uL (ref 0.7–4.0)
MCV: 91.9 fl (ref 78.0–100.0)
Monocytes Relative: 9 % (ref 3.0–12.0)
Neutro Abs: 8.3 10*3/uL — ABNORMAL HIGH (ref 1.4–7.7)
WBC: 13.7 10*3/uL — ABNORMAL HIGH (ref 4.5–10.5)

## 2013-08-17 LAB — BASIC METABOLIC PANEL
BUN: 19 mg/dL (ref 6–23)
Calcium: 9.7 mg/dL (ref 8.4–10.5)
GFR: 65.9 mL/min (ref >60.00)
Glucose, Bld: 87 mg/dL (ref 70–99)
Sodium: 132 mEq/L — ABNORMAL LOW (ref 135–145)

## 2013-08-17 LAB — URINALYSIS, ROUTINE W REFLEX MICROSCOPIC
Ketones, ur: NEGATIVE
Leukocytes, UA: NEGATIVE
Specific Gravity, Urine: 1.02 (ref 1.000–1.030)
Total Protein, Urine: NEGATIVE
Urine Glucose: NEGATIVE
pH: 6 (ref 5.0–8.0)

## 2013-08-17 LAB — LIPID PANEL
HDL: 31.8 mg/dL — ABNORMAL LOW (ref >39.00)
Triglycerides: 532 mg/dL — ABNORMAL HIGH (ref 0.0–149.0)
VLDL: 106.4 mg/dL — ABNORMAL HIGH (ref 0.0–40.0)

## 2013-08-17 LAB — HEPATIC FUNCTION PANEL
Albumin: 4.3 g/dL (ref 3.5–5.2)
Alkaline Phosphatase: 45 U/L (ref 39–117)
Total Bilirubin: 0.6 mg/dL (ref 0.3–1.2)

## 2013-08-17 NOTE — Assessment & Plan Note (Signed)
stable overall by history and exam, recent data reviewed with pt, and pt to continue medical treatment as before,  to f/u any worsening symptoms or concerns Lab Results  Component Value Date   HGBA1C 5.2 08/13/2012

## 2013-08-17 NOTE — Patient Instructions (Signed)
Please continue all other medications as before, and refills have been done if requested. Please have the pharmacy call with any other refills you may need.  Please continue your efforts at being more active, low cholesterol diet, and weight control.  You are otherwise up to date with prevention measures today.  Please go to the LAB in the Basement (turn left off the elevator) for the tests to be done today  You will be contacted by phone if any changes need to be made immediately.  Otherwise, you will receive a letter about your results with an explanation, but please check with MyChart first.  Please remember to sign up for MyChart if you have not done so, as this will be important to you in the future with finding out test results, communicating by private email, and scheduling acute appointments online when needed.  Please return in 1 year for your yearly visit, or sooner if needed, with Lab testing done 3-5 days before  

## 2013-08-17 NOTE — Progress Notes (Signed)
Subjective:    Patient ID: Derrick Petersen., male    DOB: 03-25-1941, 72 y.o.   MRN: [id number]  HPI  Here for wellness and f/u;  Overall doing ok;  Pt denies CP, worsening SOB, DOE, wheezing, orthopnea, PND, worsening LE edema, palpitations, dizziness or syncope.  Pt denies neurological change such as new headache, facial or extremity weakness.  Pt denies polydipsia, polyuria, or low sugar symptoms. Pt states overall good compliance with treatment and medications, good tolerability, and has been trying to follow lower cholesterol diet.  Pt denies worsening depressive symptoms, suicidal ideation or panic. No fever, night sweats, wt loss, loss of appetite, or other constitutional symptoms.  Pt states good ability with ADL's, has low fall risk, home safety reviewed and adequate, no other significant changes in hearing or vision, and only occasionally active with exercise.  No acute complaints.  BP runs about like today, less after he excercises at the gym Past Medical History  Diagnosis Date  . COLONIC POLYPS, HX OF 07/26/2009  . FATIGUE 07/26/2009  . GLUCOSE INTOLERANCE 07/26/2009  . HYPERLIPIDEMIA 07/26/2009  . HYPERTENSION 07/26/2009  . OTITIS EXTERNA 11/28/2009  . Impaired glucose tolerance 08/05/2011  . Polyneuropathy 09/12/2012    Mild, length dependent axonal sensorimotor polyneuropathy by NCS/EMG - sept 2013; Dr Yan/[facility]   No past surgical history on file.  reports that he quit smoking about 24 years ago. His smoking use included Cigarettes. He has a 102 pack-year smoking history. He has never used smokeless tobacco. He reports that he drinks about 1.8 ounces of alcohol per week. He reports that he does not use illicit drugs. family history includes Colon cancer in his father; Diabetes in his other; Hypertension in his father and mother. No Known Allergies Current Outpatient Prescriptions on File Prior to Visit  Medication Sig Dispense Refill  . aspirin 81 MG tablet 2 tabs by mouth  once daily  90 tablet  3  . B Complex-C (SUPER B COMPLEX PO) Take by mouth daily.        . ferrous gluconate (FERGON) 325 MG tablet Take 325 mg by mouth daily as needed.       Marland Kitchen glucosamine-chondroitin 500-400 MG tablet Take 3 tablets by mouth daily.       . hydrochlorothiazide (HYDRODIURIL) 25 MG tablet Take 1 tablet (25 mg total) by mouth daily.  90 tablet  3  . lisinopril (PRINIVIL,ZESTRIL) 20 MG tablet Take 1 tablet (20 mg total) by mouth daily.  90 tablet  3  . Magnesium 300 MG CAPS Take by mouth daily.        . Multiple Vitamin (MULTIVITAMIN) capsule Take 1 capsule by mouth daily.        . Selenium 200 MCG TABS Take 1 tablet by mouth daily.      . niacin (NIASPAN) 500 MG CR tablet Take 1 tablet (500 mg total) by mouth at bedtime.  30 tablet  3   No current facility-administered medications on file prior to visit.   Review of Systems Constitutional: Negative for diaphoresis, activity change, appetite change or unexpected weight change.  HENT: Negative for hearing loss, ear pain, facial swelling, mouth sores and neck stiffness.   Eyes: Negative for pain, redness and visual disturbance.  Respiratory: Negative for shortness of breath and wheezing.   Cardiovascular: Negative for chest pain and palpitations.  Gastrointestinal: Negative for diarrhea, blood in stool, abdominal distention or other pain Genitourinary: Negative for hematuria, flank pain or change in urine volume.  Musculoskeletal: Negative for myalgias and joint swelling.  Skin: Negative for color change and wound.  Neurological: Negative for syncope and numbness. other than noted Hematological: Negative for adenopathy.  Psychiatric/Behavioral: Negative for hallucinations, self-injury, decreased concentration and agitation.      Objective:   Physical Exam  BP 140/72  Pulse 72  Temp(Src) 98 F (36.7 C) (Oral)  Wt 217 lb 12.8 oz (98.793 kg)  BMI 30.39 kg/m2  SpO2 96% VS noted,  Constitutional: Pt is oriented to  person, place, and time. Appears well-developed and well-nourished.  Head: Normocephalic and atraumatic.  Right Ear: External ear normal.  Left Ear: External ear normal.  Nose: Nose normal.  Mouth/Throat: Oropharynx is clear and moist.  Eyes: Conjunctivae and EOM are normal. Pupils are equal, round, and reactive to light.  Neck: Normal range of motion. Neck supple. No JVD present. No tracheal deviation present.  Cardiovascular: Normal rate, regular rhythm, normal heart sounds and intact distal pulses.   Pulmonary/Chest: Effort normal and breath sounds normal.  Abdominal: Soft. Bowel sounds are normal. There is no tenderness. No HSM  Musculoskeletal: Normal range of motion. Exhibits no edema.  Lymphadenopathy:  Has no cervical adenopathy.  Neurological: Pt is alert and oriented to person, place, and time. Pt has normal reflexes. No cranial nerve deficit.  Skin: Skin is warm and dry. No rash noted.  Psychiatric:  Has  normal mood and affect. Behavior is normal.     Assessment & Plan:

## 2013-08-17 NOTE — Assessment & Plan Note (Signed)
stable overall by history and exam, recent data reviewed with pt, and pt to continue medical treatment as before,  to f/u any worsening symptoms or concerns BP Readings from Last 3 Encounters:  08/17/13 140/72  03/26/13 160/88  01/16/13 156/76

## 2013-08-17 NOTE — Assessment & Plan Note (Signed)

## 2013-08-18 LAB — VITAMIN B12: Vitamin B-12: 1013 pg/mL — ABNORMAL HIGH (ref 211–911)

## 2013-08-18 LAB — PSA: PSA: 0.59 ng/mL (ref 0.10–4.00)

## 2013-09-25 ENCOUNTER — Ambulatory Visit (HOSPITAL_COMMUNITY): Payer: Medicare Other | Attending: Cardiology | Admitting: Radiology

## 2013-09-25 DIAGNOSIS — I079 Rheumatic tricuspid valve disease, unspecified: Secondary | ICD-10-CM | POA: Insufficient documentation

## 2013-09-25 DIAGNOSIS — I059 Rheumatic mitral valve disease, unspecified: Secondary | ICD-10-CM | POA: Insufficient documentation

## 2013-09-25 DIAGNOSIS — I359 Nonrheumatic aortic valve disorder, unspecified: Secondary | ICD-10-CM | POA: Insufficient documentation

## 2013-09-25 DIAGNOSIS — I270 Primary pulmonary hypertension: Secondary | ICD-10-CM | POA: Insufficient documentation

## 2013-09-25 DIAGNOSIS — I272 Pulmonary hypertension, unspecified: Secondary | ICD-10-CM

## 2013-09-25 NOTE — Progress Notes (Signed)
Echocardiogram performed.  

## 2013-09-29 ENCOUNTER — Telehealth: Payer: Self-pay | Admitting: Emergency Medicine

## 2013-09-29 NOTE — Telephone Encounter (Signed)
Pt is requesting echo results. Pt has an appt on 11-10-13 but does not want to wait for results.  Please advise. Carron Curie, CMA

## 2013-10-01 NOTE — Telephone Encounter (Signed)
Previous echo from 12/22/12. Thanks

## 2013-10-01 NOTE — Telephone Encounter (Signed)
Called, spoke with pt.  Informed him of below results per RB.  He verbalized understanding of this.

## 2013-10-01 NOTE — Telephone Encounter (Signed)
Please let him know that his estimated PASP is 53 mmHg. This is down from 68 mmHg on his previous echo. Thanks

## 2013-11-05 ENCOUNTER — Other Ambulatory Visit: Payer: Self-pay

## 2013-11-10 ENCOUNTER — Ambulatory Visit (INDEPENDENT_AMBULATORY_CARE_PROVIDER_SITE_OTHER): Payer: Medicare Other | Admitting: Emergency Medicine

## 2013-11-10 ENCOUNTER — Encounter: Payer: Self-pay | Admitting: Emergency Medicine

## 2013-11-10 VITALS — BP 128/78 | HR 99 | Ht 71.0 in | Wt 216.8 lb

## 2013-11-10 DIAGNOSIS — I272 Pulmonary hypertension, unspecified: Secondary | ICD-10-CM

## 2013-11-10 DIAGNOSIS — I2789 Other specified pulmonary heart diseases: Secondary | ICD-10-CM

## 2013-11-10 NOTE — Assessment & Plan Note (Signed)
Asymptomatic and stable. Discussed possible testing. At this time will be conservative. He will call if any sx, rov 6

## 2013-11-10 NOTE — Patient Instructions (Signed)
Please continue to work on your blood pressure and weight loss.  Wear your oxygen at night If you develop any breathing symptoms call us so we can arrange for further testing.  Follow with Dr Delton Coombes in 6 months or sooner if you have any problems

## 2013-11-10 NOTE — Progress Notes (Signed)
Subjective:    Patient ID: Derrick Palau., male    DOB: Nov 02, 1941, 72 y.o.   MRN: [id number]  HPI 72 yo man, hx HTN, polyneuropathy, hyperglycemia.  Former smoker, hx OSA (? CPAP) w slowly progressive dyspnea. Referred for PAH eval. Dr Sherene Sires changed him from lisinopril to benicar, had trouble with this > HTN and some CP. Now back on lisinopril. He is very active, rides stationary bike.   6 min walk 01/16/13 >> no desat, 433m TTE 11/2012 >> PASP ~ 68.  He underwent a pulmonary function test that revealed mild AFL with normal FEV1, DLCO at 39%  V/Q scan was discussed but never done.  ONO showed desats > now on O2 at night PSG not done  ROV 11/10/13 -- follows up for asymptomatic PAH in setting systemic HTN. PASP 9/14 estimated at . He is on O2 at night. He feels well and is trying to manage things through lifestyle modification.    Review of Systems  Constitutional: Negative for fever and unexpected weight change.  HENT: Positive for dental problem. Negative for congestion, ear pain, nosebleeds, postnasal drip, rhinorrhea, sinus pressure, sneezing, sore throat and trouble swallowing.   Eyes: Negative for redness and itching.  Respiratory: Positive for shortness of breath. Negative for cough, chest tightness and wheezing.   Cardiovascular: Negative for palpitations and leg swelling.  Gastrointestinal: Negative for nausea and vomiting.  Genitourinary: Negative for dysuria.  Musculoskeletal: Negative for joint swelling.  Skin: Negative for rash.  Neurological: Negative for headaches.  Hematological: Does not bruise/bleed easily.  Psychiatric/Behavioral: Negative for dysphoric mood. The patient is not nervous/anxious.       Objective:   Physical Exam Filed Vitals:   11/10/13 1334  BP: 128/78  Pulse: 99  Height: 5\' 11"  (1.803 m)  Weight: 216 lb 12.8 oz (98.34 kg)  SpO2: 91%   Gen: Pleasant, well-nourished, in no distress,  normal affect  ENT: No lesions,  mouth clear,   oropharynx clear, no postnasal drip  Neck: No JVD, no TMG, no carotid bruits  Lungs: No use of accessory muscles, no dullness to percussion, clear without rales or rhonchi  Cardiovascular: RRR, heart sounds normal, no murmur or gallops, no peripheral edema  Musculoskeletal: No deformities, no cyanosis or clubbing  Neuro: alert, non focal  Skin: Warm, no lesions or rashes   09/25/13 --  - Left ventricle: The cavity size was normal. Wall thickness was normal. Systolic function was normal. The estimated ejection fraction was in the range of 60% to 65%. Septal bounce may be related to bundle branch block. Wall motion was normal; there were no regional wall motion abnormalities. Doppler parameters are consistent with abnormal left ventricular relaxation (grade 1 diastolic dysfunction). - Aortic valve: Trivial regurgitation. - Mitral valve: Trivial regurgitation. - Left atrium: The atrium was mildly dilated. - Right ventricle: The cavity size was mildly dilated. Systolic function was normal. - Right atrium: The atrium was mildly to moderately dilated. - Tricuspid valve: Peak RV-RA gradient: 48mm Hg (S). - Pulmonary arteries: PA peak pressure: 53mm Hg (S). - Inferior vena cava: The vessel was normal in size; the respirophasic diameter changes were in the normal range (= 50%); findings are consistent with normal central venous pressure. Impressions:  - Normal LV size and systolic function with EF 60-65%. Septal bounce may be due to bundle branch block. Mildly dilated RV with normal systolic function. Moderate pulmonary hypertension.       Assessment & Plan:  Pulmonary hypertension Asymptomatic and stable.  Discussed possible testing. At this time will be conservative. He will call if any sx, rov 6

## 2014-04-09 ENCOUNTER — Other Ambulatory Visit: Payer: Self-pay | Admitting: Internal Medicine

## 2014-06-08 ENCOUNTER — Encounter: Payer: Medicare Other | Admitting: Internal Medicine

## 2014-07-09 ENCOUNTER — Telehealth: Payer: Self-pay

## 2014-07-09 DIAGNOSIS — Z0000 Encounter for general adult medical examination without abnormal findings: Secondary | ICD-10-CM

## 2014-07-09 NOTE — Telephone Encounter (Signed)
cpx labs entered  

## 2014-09-27 ENCOUNTER — Other Ambulatory Visit: Payer: Self-pay | Admitting: Internal Medicine

## 2014-09-28 ENCOUNTER — Other Ambulatory Visit: Payer: Self-pay | Admitting: Internal Medicine

## 2014-10-15 ENCOUNTER — Encounter: Payer: Medicare Other | Admitting: Internal Medicine

## 2014-11-01 ENCOUNTER — Other Ambulatory Visit: Payer: Self-pay | Admitting: Internal Medicine

## 2014-11-04 ENCOUNTER — Telehealth: Payer: Self-pay | Admitting: Internal Medicine

## 2014-11-04 NOTE — Telephone Encounter (Signed)
Spoke to patient.  He said he would call us back to make an appointment for his physical.

## 2014-12-02 ENCOUNTER — Encounter: Payer: Self-pay | Admitting: Emergency Medicine

## 2014-12-02 ENCOUNTER — Ambulatory Visit: Payer: Medicare Other | Admitting: Emergency Medicine

## 2014-12-02 VITALS — BP 142/70 | HR 85 | Ht 69.0 in | Wt 220.0 lb

## 2014-12-02 DIAGNOSIS — I272 Pulmonary hypertension, unspecified: Secondary | ICD-10-CM

## 2014-12-02 NOTE — Assessment & Plan Note (Signed)
We will perform a sleep study in March at his request We will repeat his echocardiogram in 2016 Follow-up next year to review the sleep study

## 2014-12-02 NOTE — Patient Instructions (Signed)
We will perform a sleep study in March 2016 We will plan to repeat your echocardiogram in 2016.  Continue oxygen at night for now.  Follow with Dr Delton CoombesByrum in 6 months or sooner if you have any problems

## 2014-12-02 NOTE — Progress Notes (Signed)
  Subjective:    Patient ID: Derrick PalauEarl Grabe Jr., male    DOB: 01/13/1941, 73 y.o.   MRN: [id number]  HPI 73 yo man, hx HTN, polyneuropathy, hyperglycemia.  Former smoker, hx OSA (? CPAP) w slowly progressive dyspnea. Referred for PAH eval. Dr Sherene SiresWert changed him from lisinopril to benicar, had trouble with this > HTN and some CP. Now back on lisinopril. He is very active, rides stationary bike.   6 min walk 01/16/13 >> no desat, 47462m TTE 11/2012 >> PASP ~ 68.  He underwent a pulmonary function test that revealed mild AFL with normal FEV1, DLCO at 39%  V/Q scan was discussed but never done.  ONO showed desats > now on O2 at night PSG not done  ROV 12/02/14 -- follow-up visit for history of mild obstructive lung disease (apparently asymptomatic), and pulmonary hypertension. Repeat echocardiogram performed 09/25/13 estimated PASP at 53 mmHg, down from 68. He has not had a VQ scan or a sleep study. He started oxygen at night - feels a bit better overall.  He remains active, maybe not to the level of our previous visit.    Review of Systems  Constitutional: Negative for fever and unexpected weight change.  HENT: Positive for dental problem. Negative for congestion, ear pain, nosebleeds, postnasal drip, rhinorrhea, sinus pressure, sneezing, sore throat and trouble swallowing.   Eyes: Negative for redness and itching.  Respiratory: Positive for shortness of breath. Negative for cough, chest tightness and wheezing.   Cardiovascular: Negative for palpitations and leg swelling.  Gastrointestinal: Negative for nausea and vomiting.  Genitourinary: Negative for dysuria.  Musculoskeletal: Negative for joint swelling.  Skin: Negative for rash.  Neurological: Negative for headaches.  Hematological: Does not bruise/bleed easily.  Psychiatric/Behavioral: Negative for dysphoric mood. The patient is not nervous/anxious.       Objective:   Physical Exam Filed Vitals:   12/02/14 1054  BP: 142/70  Pulse: 85   Height: 5\' 9"  (1.753 m)  Weight: 220 lb (99.791 kg)  SpO2: 97%   Gen: Pleasant, well-nourished, in no distress,  normal affect  ENT: No lesions,  mouth clear,  oropharynx clear, no postnasal drip  Neck: No JVD, no TMG, no carotid bruits  Lungs: No use of accessory muscles, no dullness to percussion, clear without rales or rhonchi  Cardiovascular: RRR, heart sounds normal, no murmur or gallops, no peripheral edema  Musculoskeletal: No deformities, no cyanosis or clubbing  Neuro: alert, non focal  Skin: Warm, no lesions or rashes     Assessment & Plan:  Pulmonary hypertension We will perform a sleep study in March at his request We will repeat his echocardiogram in 2016 Follow-up next year to review the sleep study

## 2015-02-22 ENCOUNTER — Encounter (HOSPITAL_BASED_OUTPATIENT_CLINIC_OR_DEPARTMENT_OTHER): Payer: Medicare Other

## 2015-05-16 ENCOUNTER — Telehealth: Payer: Self-pay | Admitting: Emergency Medicine

## 2015-05-16 NOTE — Telephone Encounter (Signed)
Spoke with pt.  Order in chart for pt to have Split night sleep study.  Per last OV instructions, this was supposed to be scheduled in March 2016. Pt states he hd this scheduled but had to cancel and would now like to reschedule.  Pt requesting to schedule appt himself.  Provided pt with [facility]'s #.  He verbalized understanding, states he will call to schedule, and voiced no further questions or concerns at this time.

## 2015-05-16 NOTE — Telephone Encounter (Signed)
LMTCB x 1 

## 2015-05-16 NOTE — Telephone Encounter (Signed)
Return [name] °

## 2015-07-11 ENCOUNTER — Ambulatory Visit (HOSPITAL_BASED_OUTPATIENT_CLINIC_OR_DEPARTMENT_OTHER): Payer: Medicare Other | Attending: Emergency Medicine | Admitting: Radiology

## 2015-07-11 VITALS — Ht 69.0 in | Wt 215.0 lb

## 2015-07-11 DIAGNOSIS — I493 Ventricular premature depolarization: Secondary | ICD-10-CM | POA: Insufficient documentation

## 2015-07-11 DIAGNOSIS — I270 Primary pulmonary hypertension: Secondary | ICD-10-CM

## 2015-07-11 DIAGNOSIS — I10 Essential (primary) hypertension: Secondary | ICD-10-CM | POA: Diagnosis present

## 2015-07-11 DIAGNOSIS — I272 Pulmonary hypertension, unspecified: Secondary | ICD-10-CM

## 2015-07-11 DIAGNOSIS — G4733 Obstructive sleep apnea (adult) (pediatric): Secondary | ICD-10-CM | POA: Insufficient documentation

## 2015-07-11 DIAGNOSIS — R0683 Snoring: Secondary | ICD-10-CM | POA: Insufficient documentation

## 2015-07-15 DIAGNOSIS — G4733 Obstructive sleep apnea (adult) (pediatric): Secondary | ICD-10-CM | POA: Diagnosis not present

## 2015-07-15 NOTE — Addendum Note (Signed)
Addended by: Coralyn HellingSOOD, Saintclair Schroader on: 07/15/2015 12:17 PM   Modules accepted: Level of Service

## 2015-07-15 NOTE — Progress Notes (Signed)
Patient Name: Derrick Petersen, Derrick Petersen Date: 07/11/2015 Gender: Male D.O.B: 05-Jul-1941 Age (years): 35 Referring Provider: Not Available Height (inches): 69 Interpreting Physician: Chesley Mires MD, ABSM Weight (lbs): 215 RPSGT: Carolin Coy BMI: 32 MRN: [id number] Neck Size: 16.00   CLINICAL INFORMATION Sleep Study Type: Split Night CPAP  Indication for sleep study: Hypertension  Epworth Sleepiness Score: 12  SLEEP STUDY TECHNIQUE As per the AASM Manual for the Scoring of Sleep and Associated Events v2.3 (April 2016) with a hypopnea requiring 4% desaturations.  The channels recorded and monitored were frontal, central and occipital EEG, electrooculogram (EOG), submentalis EMG (chin), nasal and oral airflow, thoracic and abdominal wall motion, anterior tibialis EMG, snore microphone, electrocardiogram, and pulse oximetry. Continuous positive airway pressure (CPAP) was initiated when the patient met split night criteria and was titrated according to treat sleep-disordered breathing.  MEDICATIONS Medications taken by the patient : reviewed in electronic medical record. Medications administered by patient during sleep study : Sleep medicine administered - Aspirin at 09:00:42 PM  RESPIRATORY PARAMETERS Diagnostic  Total AHI (/hr): 16.2 RDI (/hr): 20.9 OA Index (/hr): 4.2 CA Index (/hr): 0.5 REM AHI (/hr): N/A NREM AHI (/hr): 16.2 Supine AHI (/hr): 23.8 Non-supine AHI (/hr): 9.08 Min O2 Sat (%): 83.00 Mean O2 (%): 88.42 Time below 88% (min): 57.3   Titration  Optimal Pressure (cm): 10 AHI at Optimal Pressure (/hr): 1.6 Min O2 at Optimal Pressure (%): 90.0 Supine % at Optimal (%): 0 Sleep % at Optimal (%): 97    SLEEP ARCHITECTURE The recording time for the entire night was 401.4 minutes.  During a baseline period of 229.9 minutes, the patient slept for 115.0 minutes in REM and nonREM, yielding a sleep efficiency of 50.0%. Sleep onset after lights out was 7.6 minutes with a REM  latency of N/A minutes. The patient spent 29.57% of the night in stage N1 sleep, 70.43% in stage N2 sleep, 0.00% in stage N3 and 0.00% in REM.  During the titration period of 166.9 minutes, the patient slept for 130.0 minutes in REM and nonREM, yielding a sleep efficiency of 77.9%. Sleep onset after CPAP initiation was 7.1 minutes with a REM latency of 18.5 minutes. The patient spent 6.54% of the night in stage N1 sleep, 49.62% in stage N2 sleep, 0.38% in stage N3 and 43.46% in REM.  CARDIAC DATA The 2 lead EKG demonstrated sinus rhythm. The mean heart rate was N/A beats per minute. Other EKG findings include: PVCs.  LEG MOVEMENT DATA The total Periodic Limb Movements of Sleep (PLMS) were 255. The PLMS index was 62.45 .  IMPRESSIONS Moderate obstructive sleep apnea occurred during the diagnostic portion of the study(AHI = 16.2/hour). An optimal PAP pressure was selected for this patient ( 10 cm of water) No significant central sleep apnea occurred during the diagnostic portion of the study (CAI = 0.5/hour). Severe oxygen desaturation was noted during the diagnostic portion of the study (Min O2 = 83.00%). The patient snored with Moderate snoring volume during the diagnostic portion of the study. EKG findings include PVCs. Severe periodic limb movements of sleep occurred during the study.  DIAGNOSIS Obstructive Sleep Apnea (327.23 [G47.33 ICD-10])  RECOMMENDATIONS Trial of CPAP therapy on 10 cm H2O with a Medium size Resmed Full Face Mask AirFit F10 mask and heated humidification. Avoid alcohol, sedatives and other CNS depressants that may worsen sleep apnea and disrupt normal sleep architecture. Sleep hygiene should be reviewed to assess factors that may improve sleep quality. Weight management and regular exercise should  be initiated or continued.  Chesley Mires, MD [facility] 07/15/2015, 12:16 PM American Board of Sleep Medicine

## 2015-08-03 ENCOUNTER — Telehealth: Payer: Self-pay | Admitting: Emergency Medicine

## 2015-08-03 NOTE — Telephone Encounter (Signed)
Spoke with pt, requesting sleep study results be sent to his home address.  Verified address on file.  Document printed and placed in outgoing mail.  Nothing further needed.

## 2015-08-10 ENCOUNTER — Ambulatory Visit: Payer: Self-pay | Admitting: Emergency Medicine

## 2015-08-12 ENCOUNTER — Ambulatory Visit: Payer: Medicare Other | Admitting: Emergency Medicine

## 2015-08-15 ENCOUNTER — Encounter: Payer: Self-pay | Admitting: Emergency Medicine

## 2015-08-15 ENCOUNTER — Ambulatory Visit (INDEPENDENT_AMBULATORY_CARE_PROVIDER_SITE_OTHER): Payer: Medicare Other | Admitting: Emergency Medicine

## 2015-08-15 VITALS — BP 134/74 | HR 81 | Ht 69.0 in | Wt 221.0 lb

## 2015-08-15 DIAGNOSIS — G4733 Obstructive sleep apnea (adult) (pediatric): Secondary | ICD-10-CM | POA: Diagnosis not present

## 2015-08-15 DIAGNOSIS — J309 Allergic rhinitis, unspecified: Secondary | ICD-10-CM

## 2015-08-15 DIAGNOSIS — J449 Chronic obstructive pulmonary disease, unspecified: Secondary | ICD-10-CM

## 2015-08-15 MED ORDER — ALBUTEROL SULFATE 108 (90 BASE) MCG/ACT IN AEPB: 2.0000 | INHALATION_SPRAY | RESPIRATORY_TRACT | Status: DC | PRN

## 2015-08-15 NOTE — Assessment & Plan Note (Signed)
Patient has been diagnosed with moderate obstructive sleep apnea based on recent polysomnogram. We will start CPAP at 10 cm water with a Medium size Resmed Full Face Mask AirFit F10 mask and heated humidification. I will follow with him and 3-4 months to assess his compliance and progress

## 2015-08-15 NOTE — Assessment & Plan Note (Signed)
No clear indication at this time to start scheduled bronchodilators. I will teach him how to use a short acting albuterol when necessary

## 2015-08-15 NOTE — Progress Notes (Signed)
Subjective:    Patient ID: Derrick Palau., male    DOB: 06/18/1941, 74 y.o.   MRN: [id number]  HPI 74 yo man, hx HTN, polyneuropathy, hyperglycemia.  Former smoker, hx OSA (? CPAP) w slowly progressive dyspnea. Referred for PAH eval. Dr Sherene Sires changed him from lisinopril to benicar, had trouble with this > HTN and some CP. Now back on lisinopril. He is very active, rides stationary bike.   6 min walk 01/16/13 >> no desat, 436m TTE 11/2012 >> PASP ~ 68.  He underwent a pulmonary function test that revealed mild AFL with normal FEV1, DLCO at 39%  V/Q scan was discussed but never done.  ONO showed desats > now on O2 at night PSG not done  ROV 12/02/14 -- follow-up visit for history of mild obstructive lung disease (apparently asymptomatic), and pulmonary hypertension. Repeat echocardiogram performed 09/25/13 estimated PASP at 53 mmHg, down from 68. He has not had a VQ scan or a sleep study. He started oxygen at night - feels a bit better overall.  He remains active, maybe not to the level of our previous visit.   ROV 08/15/15 -- follow-up visit for mild obstructive lung disease and pulmonary hypertension.  He had a sleep study done 07/15/15 that showed moderate obstructive sleep apnea (AHI equals 16.2 per hour). He was titrated to an optimal pressure of 10 cm water.  He does state that he can fall asleep accidentally, naps occasionally. No issues with driving. His breathing has been doing well until summer heat. He is also having more allergy sx, is using loratadine prn..  He has never uses SABA.    Review of Systems  Constitutional: Negative for fever and unexpected weight change.  HENT: Negative for congestion, dental problem, ear pain, nosebleeds, postnasal drip, rhinorrhea, sinus pressure, sneezing, sore throat and trouble swallowing.   Eyes: Negative for redness and itching.  Respiratory: Positive for shortness of breath. Negative for cough, chest tightness and wheezing.   Cardiovascular:  Negative for palpitations and leg swelling.  Gastrointestinal: Negative for nausea and vomiting.  Genitourinary: Negative for dysuria.  Musculoskeletal: Negative for joint swelling.  Skin: Negative for rash.  Neurological: Negative for headaches.  Hematological: Does not bruise/bleed easily.  Psychiatric/Behavioral: Negative for dysphoric mood. The patient is not nervous/anxious.       Objective:   Physical Exam Filed Vitals:   08/15/15 1559  BP: 134/74  Pulse: 81  Height:  (1.753 m)  Weight: 221 lb (100.245 kg)  SpO2: 93%   Gen: Pleasant, well-nourished, in no distress,  normal affect  ENT: No lesions,  mouth clear,  oropharynx clear, no postnasal drip  Neck: No JVD, no TMG, no carotid bruits  Lungs: No use of accessory muscles,  clear without rales or rhonchi  Cardiovascular: RRR, heart sounds normal, no murmur or gallops, no peripheral edema  Musculoskeletal: No deformities, no cyanosis or clubbing  Neuro: alert, non focal  Skin: Warm, no lesions or rashes     Assessment & Plan:  Obstructive sleep apnea Patient has been diagnosed with moderate obstructive sleep apnea based on recent polysomnogram. We will start CPAP at 10 cm water with a Medium size Resmed Full Face Mask AirFit F10 mask and heated humidification. I will follow with him and 3-4 months to assess his compliance and progress  COPD, mild No clear indication at this time to start scheduled bronchodilators. I will teach him how to use a short acting albuterol when necessary  Allergic rhinitis He will  continue loratadine. We briefly discussed the potential benefits of stopping his lisinopril if he is bothered by chronic cough in the future

## 2015-08-15 NOTE — Assessment & Plan Note (Signed)
He will continue loratadine. We briefly discussed the potential benefits of stopping his lisinopril if he is bothered by chronic cough in the future

## 2015-08-15 NOTE — Addendum Note (Signed)
Addended by: Jaynee Eagles C on: 08/15/2015 04:37 PM   Modules accepted: Orders

## 2015-08-15 NOTE — Patient Instructions (Signed)
We will start CPAP with a full facemask Use pro-air 2 puffs up to every 4 hours if needed for shortness of breath Follow with Dr Delton Coombes in 4 months or sooner if you have any problems.

## 2015-10-21 ENCOUNTER — Encounter: Payer: Self-pay | Admitting: Gastroenterology

## 2015-10-26 ENCOUNTER — Encounter: Payer: Self-pay | Admitting: Gastroenterology

## 2015-11-29 ENCOUNTER — Other Ambulatory Visit: Payer: Medicare Other

## 2015-11-29 ENCOUNTER — Ambulatory Visit (INDEPENDENT_AMBULATORY_CARE_PROVIDER_SITE_OTHER): Payer: Medicare Other | Admitting: Emergency Medicine

## 2015-11-29 ENCOUNTER — Encounter: Payer: Self-pay | Admitting: Emergency Medicine

## 2015-11-29 VITALS — BP 140/88 | HR 89 | Wt 216.0 lb

## 2015-11-29 DIAGNOSIS — I272 Other secondary pulmonary hypertension: Secondary | ICD-10-CM

## 2015-11-29 NOTE — Progress Notes (Signed)
Subjective:    Patient ID: Derrick Petersen., male    DOB: 03/06/1941, 74 y.o.   MRN: [id number]  HPI 74 yo man, hx HTN, polyneuropathy, hyperglycemia.  Former smoker, hx OSA (? CPAP) w slowly progressive dyspnea. Referred for PAH eval. Dr Sherene SiresWert changed him from lisinopril to benicar, had trouble with this > HTN and some CP. Now back on lisinopril. He is very active, rides stationary bike.   6 min walk 01/16/13 >> no desat, 47575m TTE 11/2012 >> PASP ~ 68.  He underwent a pulmonary function test that revealed mild AFL with normal FEV1, DLCO at 39%  V/Q scan was discussed but never done.  ONO showed desats > now on O2 at night PSG not done  ROV 12/02/14 -- follow-up visit for history of mild obstructive lung disease (apparently asymptomatic), and pulmonary hypertension. Repeat echocardiogram performed 09/25/13 estimated PASP at 53 mmHg, down from 68. He has not had a VQ scan or a sleep study. He started oxygen at night - feels a bit better overall.  He remains active, maybe not to the level of our previous visit.   ROV 08/15/15 -- follow-up visit for mild obstructive lung disease and pulmonary hypertension.  He had a sleep study done 07/15/15 that showed moderate obstructive sleep apnea (AHI equals 16.2 per hour). He was titrated to an optimal pressure of 10 cm water.  He does state that he can fall asleep accidentally, naps occasionally. No issues with driving. His breathing has been doing well until summer heat. He is also having more allergy sx, is using loratadine prn..  He has never uses SABA.   ROV 11/29/15 -- follow-up visit for secondary pulmonary hypertension, obstructive sleep apnea, mild obstructive lung disease. At last visit we started him on CPAP 10 cm water. Some difficulty tolerating recently due to a sinus infxn. Has been treated with augmentin. He has been noted to have exertional desaturations after he bought an oximeter and checked himself with exertion. We confirmed that he does  desaturate today with walking.  He does believe that his exercise tolerance has decreased some. He has not had V/q scan or serologies. He does believe albuterol helped him.   Review of Systems  Constitutional: Negative for fever and unexpected weight change.  HENT: Negative for congestion, dental problem, ear pain, nosebleeds, postnasal drip, rhinorrhea, sinus pressure, sneezing, sore throat and trouble swallowing.   Eyes: Negative for redness and itching.  Respiratory: Positive for shortness of breath. Negative for cough, chest tightness and wheezing.   Cardiovascular: Negative for palpitations and leg swelling.  Gastrointestinal: Negative for nausea and vomiting.  Genitourinary: Negative for dysuria.  Musculoskeletal: Negative for joint swelling.  Skin: Negative for rash.  Neurological: Negative for headaches.  Hematological: Does not bruise/bleed easily.  Psychiatric/Behavioral: Negative for dysphoric mood. The patient is not nervous/anxious.       Objective:   Physical Exam Filed Vitals:   11/29/15 1432  BP: 140/88  Pulse: 89  Weight: 216 lb (97.977 kg)  SpO2: 92%   Gen: Pleasant, well-nourished, in no distress,  normal affect  ENT: No lesions,  mouth clear,  oropharynx clear, no postnasal drip  Neck: No JVD, no TMG, no carotid bruits  Lungs: No use of accessory muscles,  clear without rales or rhonchi  Cardiovascular: RRR, heart sounds normal, no murmur or gallops, no peripheral edema  Musculoskeletal: No deformities, no cyanosis or clubbing  Neuro: alert, non focal  Skin: Warm, no lesions or rashes  Assessment & Plan:  Obstructive sleep apnea Continue CPAP. We will try to give him nasal pillows as this may be more comfortable for him than the full facemask.   Pulmonary hypertension Contributors to date include sleep apnea and some degree of obstructive lung disease. Severity of the obstructive lung disease was mild on his previous PFTs in 2013 by like to  reassess this with new PFT. Surprisingly he has desaturation with exertion today and qualifies for oxygen with ambulation,  we will arrange for this today. We will check serologies and a VQ scan (which was never done).   COPD, mild Mild by his last PFT in 2013. I believe this is to be repeated. Depending on the results we will decide whether he needs to be on scheduled bronchodilators. Continue his albuterol when necessary for now

## 2015-11-29 NOTE — Assessment & Plan Note (Signed)
Contributors to date include sleep apnea and some degree of obstructive lung disease. Severity of the obstructive lung disease was mild on his previous PFTs in 2013 by like to reassess this with new PFT. Surprisingly he has desaturation with exertion today and qualifies for oxygen with ambulation,  we will arrange for this today. We will check serologies and a VQ scan (which was never done).

## 2015-11-29 NOTE — Assessment & Plan Note (Signed)
Continue CPAP. We will try to give him nasal pillows as this may be more comfortable for him than the full facemask.

## 2015-11-29 NOTE — Patient Instructions (Addendum)
Please continue your CPAP every night. We will try to help you get nasal pillows if these would be more comfortable.  We will perform blood work today We will perform a ventilation perfusion scan of your lungs  We will repeat your pulmonary function testing.  We will start you on oxygen to use 2L/min with exertion.  You may need to be on an every day scheduled inhaled medication. We will discuss further next visit.  Follow with Dr Delton CoombesByrum in 2 months or sooner if you have any problems.

## 2015-11-29 NOTE — Assessment & Plan Note (Signed)
Mild by his last PFT in 2013. I believe this is to be repeated. Depending on the results we will decide whether he needs to be on scheduled bronchodilators. Continue his albuterol when necessary for now

## 2015-11-30 LAB — ANA: ANA: NEGATIVE

## 2015-11-30 LAB — RHEUMATOID FACTOR: Rhuematoid fact SerPl-aCnc: <10 IU/mL (ref <=14)

## 2015-12-01 LAB — ANTI-SCLERODERMA ANTIBODY: Scleroderma (Scl-70) (ENA) Antibody, IgG: <1

## 2015-12-01 LAB — RNP ANTIBODY: Ribonucleic Protein(ENA) Antibody, IgG: <1

## 2015-12-27 ENCOUNTER — Ambulatory Visit (HOSPITAL_COMMUNITY): Payer: Medicare Other

## 2015-12-27 ENCOUNTER — Encounter (HOSPITAL_COMMUNITY): Admission: RE | Admit: 2015-12-27 | Payer: Medicare Other | Source: Ambulatory Visit

## 2015-12-29 ENCOUNTER — Telehealth: Payer: Self-pay | Admitting: Emergency Medicine

## 2015-12-29 NOTE — Telephone Encounter (Signed)
Called spoke with pt. He is calling to get his lung perfusion scan r/s'd.  [facility]. Pt needs to arrive at 1:15 for CXR and 2pm for lung scan. Called made pt aware of new date/time. He verbalized understanding and needed nothing further

## 2016-01-06 ENCOUNTER — Ambulatory Visit (HOSPITAL_COMMUNITY)
Admission: RE | Admit: 2016-01-06 | Discharge: 2016-01-06 | Disposition: A | Discharge status: Home / Self Care | Payer: Medicare Other | Source: Ambulatory Visit | Attending: Emergency Medicine | Admitting: Emergency Medicine

## 2016-01-06 DIAGNOSIS — R0602 Shortness of breath: Secondary | ICD-10-CM | POA: Insufficient documentation

## 2016-01-06 DIAGNOSIS — I272 Other secondary pulmonary hypertension: Secondary | ICD-10-CM | POA: Insufficient documentation

## 2016-01-06 DIAGNOSIS — Z87891 Personal history of nicotine dependence: Secondary | ICD-10-CM | POA: Insufficient documentation

## 2016-01-06 DIAGNOSIS — I10 Essential (primary) hypertension: Secondary | ICD-10-CM | POA: Diagnosis not present

## 2016-01-06 DIAGNOSIS — R5383 Other fatigue: Secondary | ICD-10-CM | POA: Diagnosis not present

## 2016-01-06 MED ORDER — TECHNETIUM TC 99M DIETHYLENETRIAME-PENTAACETIC ACID: 33.0000 | Freq: Once | INTRAVENOUS | Status: DC | PRN

## 2016-01-06 MED ORDER — TECHNETIUM TO 99M ALBUMIN AGGREGATED
4.3000 | Freq: Once | INTRAVENOUS | Status: AC | PRN
  Administered 2016-01-06: 4.3 via INTRAVENOUS

## 2016-02-02 ENCOUNTER — Other Ambulatory Visit: Payer: Self-pay | Admitting: Emergency Medicine

## 2016-02-02 DIAGNOSIS — R0600 Dyspnea, unspecified: Secondary | ICD-10-CM

## 2016-02-03 ENCOUNTER — Encounter: Payer: Self-pay | Admitting: Emergency Medicine

## 2016-02-03 ENCOUNTER — Ambulatory Visit (INDEPENDENT_AMBULATORY_CARE_PROVIDER_SITE_OTHER): Payer: Medicare Other | Admitting: Emergency Medicine

## 2016-02-03 VITALS — BP 142/80 | HR 82 | Ht 71.0 in | Wt 215.0 lb

## 2016-02-03 DIAGNOSIS — I272 Other secondary pulmonary hypertension: Secondary | ICD-10-CM | POA: Diagnosis not present

## 2016-02-03 DIAGNOSIS — G4733 Obstructive sleep apnea (adult) (pediatric): Secondary | ICD-10-CM | POA: Diagnosis not present

## 2016-02-03 DIAGNOSIS — R0600 Dyspnea, unspecified: Secondary | ICD-10-CM

## 2016-02-03 LAB — PULMONARY FUNCTION TEST
DL/VA % PRED: 31 %
DL/VA: 1.45 ml/min/mmHg/L
DLCO UNC: 8.34 ml/min/mmHg
DLCO unc % pred: 24 %
FEF 25-75 POST: 1.69 L/s
FEF 25-75 Pre: 1.59 L/sec
FEF2575-%Change-Post: 6 %
FEF2575-%PRED-PRE: 66 %
FEF2575-%Pred-Post: 71 %
FEV1-%Change-Post: 2 %
FEV1-%PRED-POST: 88 %
FEV1-%Pred-Pre: 85 %
FEV1-PRE: 2.78 L
FEV1-Post: 2.86 L
FEV1FVC-%Change-Post: 3 %
FEV1FVC-%PRED-PRE: 92 %
FEV6-%Change-Post: 0 %
FEV6-%Pred-Post: 96 %
FEV6-%Pred-Pre: 96 %
FEV6-POST: 4.04 L
FEV6-Pre: 4.05 L
FEV6FVC-%Change-Post: 0 %
FEV6FVC-%PRED-POST: 105 %
FEV6FVC-%Pred-Pre: 104 %
FVC-%Change-Post: -1 %
FVC-%PRED-PRE: 92 %
FVC-%Pred-Post: 91 %
FVC-POST: 4.07 L
FVC-PRE: 4.11 L
POST FEV1/FVC RATIO: 70 %
PRE FEV1/FVC RATIO: 68 %
Post FEV6/FVC ratio: 99 %
Pre FEV6/FVC Ratio: 99 %
RV % pred: 41 %
RV: 1.06 L
TLC % PRED: 83 %
TLC: 6.03 L

## 2016-02-03 MED ORDER — TIOTROPIUM BROMIDE MONOHYDRATE 18 MCG IN CAPS: 18.0000 ug | ORAL_CAPSULE | Freq: Every day | RESPIRATORY_TRACT | Status: DC

## 2016-02-03 NOTE — Patient Instructions (Addendum)
We'll perform a transcranial Doppler with bubble study to rule out arteriovenous shunting Will perform a high-resolution CT scan of the chest without contrast Please start Spiriva 1 inhalation daily Please use your oxygen at 2 L/m with all exertion We will consider and revisit the utility of a right heart catheterization at your next visit Follow with Dr Delton Coombes next available

## 2016-02-03 NOTE — Assessment & Plan Note (Addendum)
Certainly with contributions from COPD and sleep apnea but the severity and his desaturations do appear to be out of proportion to these problems. VQ scan was low probability for chronic PE.Marland Kitchen His serologies were reviewed today and were negative. Even his diffusion defect of believe he needs [place] high-resolution CT scan of the chest and we will order this today also believe he needs a shunt study with transcranial Dopplers. Discussed performing a right heart catheterization with him today. He is apprehensive about this and would like to wait until the other workup has been done. We will revisit next time

## 2016-02-03 NOTE — Progress Notes (Signed)
Subjective:    Patient ID: Derrick Petersen., male    DOB: 1941-09-09, 75 y.o.   MRN: [id number]  HPI 75 yo man, hx HTN, polyneuropathy, hyperglycemia.  Former smoker, hx OSA (? CPAP) w slowly progressive dyspnea. Referred for PAH eval. Dr Sherene Sires changed him from lisinopril to benicar, had trouble with this > HTN and some CP. Now back on lisinopril. He is very active, rides stationary bike.   6 min walk 01/16/13 >> no desat, 444m TTE 11/2012 >> PASP ~ 68.  He underwent a pulmonary function test that revealed mild AFL with normal FEV1, DLCO at 39%  V/Q scan was discussed but never done.  ONO showed desats > now on O2 at night PSG not done  ROV 12/02/14 -- follow-up visit for history of mild obstructive lung disease (apparently asymptomatic), and pulmonary hypertension. Repeat echocardiogram performed 09/25/13 estimated PASP at 53 mmHg, down from 68. He has not had a VQ scan or a sleep study. He started oxygen at night - feels a bit better overall.  He remains active, maybe not to the level of our previous visit.   ROV 08/15/15 -- follow-up visit for mild obstructive lung disease and pulmonary hypertension.  He had a sleep study done 07/15/15 that showed moderate obstructive sleep apnea (AHI equals 16.2 per hour). He was titrated to an optimal pressure of 10 cm water.  He does state that he can fall asleep accidentally, naps occasionally. No issues with driving. His breathing has been doing well until summer heat. He is also having more allergy sx, is using loratadine prn..  He has never uses SABA.   ROV 11/29/15 -- follow-up visit for secondary pulmonary hypertension, obstructive sleep apnea, mild obstructive lung disease. At last visit we started him on CPAP 10 cm water. Some difficulty tolerating recently due to a sinus infxn. Has been treated with augmentin. He has been noted to have exertional desaturations after he bought an oximeter and checked himself with exertion. We confirmed that he does  desaturate today with walking.  He does believe that his exercise tolerance has decreased some. He has not had V/q scan or serologies. He does believe albuterol helped him.   ROV 02/03/16 --  Follow up for secondary PAH, OSA (treated), COPD. His eval for PAH has continued > serologies negative, relation perfusion scan was low probability on review today, PFT's done today that I have personally reviewed. These show mild obstruction without a bronchodilator response. His FEV1 is 2.78 L which is decreased from 3.03 L in 2013. He had severe diffusion defect with some evidence of mild restriction based on a decreased residual volume. As above he has documented hypoxemia with exertion. He has not been wearing his oxygen reliably with all walking. I encouraged him to do so   Review of Systems  Constitutional: Negative for fever and unexpected weight change.  HENT: Negative for congestion, dental problem, ear pain, nosebleeds, postnasal drip, rhinorrhea, sinus pressure, sneezing, sore throat and trouble swallowing.   Eyes: Negative for redness and itching.  Respiratory: Positive for shortness of breath. Negative for cough, chest tightness and wheezing.   Cardiovascular: Negative for palpitations and leg swelling.  Gastrointestinal: Negative for nausea and vomiting.  Genitourinary: Negative for dysuria.  Musculoskeletal: Negative for joint swelling.  Skin: Negative for rash.  Neurological: Negative for headaches.  Hematological: Does not bruise/bleed easily.  Psychiatric/Behavioral: Negative for dysphoric mood. The patient is not nervous/anxious.       Objective:   Physical Exam  Filed Vitals:   02/03/16 1354  BP: 142/80  Pulse: 82  Height:  (1.803 m)  Weight: 215 lb (97.523 kg)  SpO2: 94%   Gen: Pleasant, well-nourished, in no distress,  normal affect  ENT: No lesions,  mouth clear,  oropharynx clear, no postnasal drip  Neck: No JVD, no TMG, no carotid bruits  Lungs: No use of  accessory muscles,  clear without rales or rhonchi  Cardiovascular: RRR, heart sounds normal, no murmur or gallops, no peripheral edema  Musculoskeletal: No deformities, no cyanosis or clubbing  Neuro: alert, non focal  Skin: Warm, no lesions or rashes     Assessment & Plan:  COPD, mild Spirometry today confirm mild obstruction but with a decrease in FEV1 compared with 2013. I think at this point given his hypoxemia and the potential contribution of COPD to this and also to his portal hypertension we should start scheduled therapy. I'll start him on Spiriva one inhalation daily and see how he responds  Pulmonary hypertension Certainly with contributions from COPD and sleep apnea but the severity and his desaturations do appear to be out of proportion to these problems. His serologies were reviewed today and were negative. Even his diffusion defect of believe he needs [place] high-resolution CT scan of the chest and we will order this today also believe he needs a shunt study with transcranial Dopplers. Discussed performing a right heart catheterization with him today. He is apprehensive about this and would like to wait until the other workup has been done. We will revisit next time  Obstructive sleep apnea Tolerating CPAP

## 2016-02-03 NOTE — Progress Notes (Signed)
PFT done today. 

## 2016-02-03 NOTE — Assessment & Plan Note (Signed)
Spirometry today confirm mild obstruction but with a decrease in FEV1 compared with 2013. I think at this point given his hypoxemia and the potential contribution of COPD to this and also to his portal hypertension we should start scheduled therapy. I'll start him on Spiriva one inhalation daily and see how he responds

## 2016-02-03 NOTE — Assessment & Plan Note (Signed)
Tolerating CPAP 

## 2016-02-08 ENCOUNTER — Other Ambulatory Visit (HOSPITAL_COMMUNITY): Payer: Medicare Other

## 2016-02-08 ENCOUNTER — Ambulatory Visit (INDEPENDENT_AMBULATORY_CARE_PROVIDER_SITE_OTHER)
Admission: RE | Admit: 2016-02-08 | Discharge: 2016-02-08 | Disposition: A | Discharge status: Home / Self Care | Payer: Medicare Other | Source: Ambulatory Visit | Attending: Emergency Medicine | Admitting: Emergency Medicine

## 2016-02-08 DIAGNOSIS — J449 Chronic obstructive pulmonary disease, unspecified: Secondary | ICD-10-CM

## 2016-02-08 DIAGNOSIS — I272 Other secondary pulmonary hypertension: Secondary | ICD-10-CM | POA: Diagnosis not present

## 2016-02-08 DIAGNOSIS — R0600 Dyspnea, unspecified: Secondary | ICD-10-CM

## 2016-02-21 ENCOUNTER — Ambulatory Visit (HOSPITAL_COMMUNITY)
Admission: RE | Admit: 2016-02-21 | Discharge: 2016-02-21 | Disposition: A | Discharge status: Home / Self Care | Payer: Medicare Other | Source: Ambulatory Visit | Attending: Emergency Medicine | Admitting: Emergency Medicine

## 2016-02-21 DIAGNOSIS — I272 Other secondary pulmonary hypertension: Secondary | ICD-10-CM | POA: Insufficient documentation

## 2016-02-21 DIAGNOSIS — R938 Abnormal findings on diagnostic imaging of other specified body structures: Secondary | ICD-10-CM | POA: Diagnosis not present

## 2016-02-21 NOTE — Progress Notes (Signed)
*  PRELIMINARY RESULTS* Vascular Ultrasound Transcranial Doppler with Bubbles has been completed with Dr. Pearlean Brownie. There is evidence of High Intensity Transient Signals (HITS) heard at rest 15 seconds after bubble injection. This is suggestive of a small intrapulmonary shunt. There is no evidence of HITS with valsalva maneuver.  02/21/2016 3:01 PM Gertie Fey, RVT, RDCS, RDMS

## 2016-03-08 ENCOUNTER — Ambulatory Visit (INDEPENDENT_AMBULATORY_CARE_PROVIDER_SITE_OTHER): Payer: Medicare Other | Admitting: Emergency Medicine

## 2016-03-08 ENCOUNTER — Encounter: Payer: Self-pay | Admitting: Emergency Medicine

## 2016-03-08 ENCOUNTER — Other Ambulatory Visit (INDEPENDENT_AMBULATORY_CARE_PROVIDER_SITE_OTHER): Payer: Medicare Other

## 2016-03-08 VITALS — BP 140/80 | HR 100 | Ht 70.0 in | Wt 213.0 lb

## 2016-03-08 DIAGNOSIS — R0902 Hypoxemia: Secondary | ICD-10-CM | POA: Diagnosis not present

## 2016-03-08 DIAGNOSIS — I272 Other secondary pulmonary hypertension: Secondary | ICD-10-CM

## 2016-03-08 DIAGNOSIS — G4733 Obstructive sleep apnea (adult) (pediatric): Secondary | ICD-10-CM | POA: Diagnosis not present

## 2016-03-08 DIAGNOSIS — J449 Chronic obstructive pulmonary disease, unspecified: Secondary | ICD-10-CM | POA: Diagnosis not present

## 2016-03-08 LAB — BASIC METABOLIC PANEL
BUN: 25 mg/dL — ABNORMAL HIGH (ref 6–23)
CHLORIDE: 98 meq/L (ref 96–112)
CO2: 24 meq/L (ref 19–32)
Calcium: 10.1 mg/dL (ref 8.4–10.5)
Creatinine, Ser: 1.14 mg/dL (ref 0.40–1.50)
GFR: 66.76 mL/min (ref >60.00)
GLUCOSE: 88 mg/dL (ref 70–99)
POTASSIUM: 3.8 meq/L (ref 3.5–5.1)
SODIUM: 132 meq/L — AB (ref 135–145)

## 2016-03-08 NOTE — Patient Instructions (Signed)
We will perform a CT scan with contrast to evaluate for arteriovenous shunting and malformations.  We will perform a repeat echocardiogram with bubble study to look for shunting.  Discontinue Spiriva every day for now. We may decide to alter this in the future Please continue your oxygen at 4 L/m Follow with Dr Delton CoombesByrum in 1 month

## 2016-03-08 NOTE — Assessment & Plan Note (Addendum)
Based on echocardiogram last done in 2014. I believe he needs a repeat echocardiogram to confirm pulmonary pressures. We will perform this with a bubble study in order to look for possible shunt physiology although I suspect that his right to left shunt is intrapulmonary given the timing seen on his transcranial Dopplers. He has a right to left shunt that appears to be intrapulmonary given his transcranial Dopplers. Etiologies could be intrapulmonary AVM or less likely an intracardiac shunt. I believe he needs a CT with contrast to look for AVMs.

## 2016-03-08 NOTE — Progress Notes (Signed)
Subjective:    Patient ID: Derrick Petersen., male    DOB: 06-Mar-1941, 75 y.o.   MRN: [id number]  HPI 75 yo man, hx HTN, polyneuropathy, hyperglycemia.  Former smoker, hx OSA (? CPAP) w slowly progressive dyspnea. Referred for PAH eval. Dr Sherene Sires changed him from lisinopril to benicar, had trouble with this > HTN and some CP. Now back on lisinopril. He is very active, rides stationary bike.   6 min walk 01/16/13 >> no desat, 421m TTE 11/2012 >> PASP ~ 68.  He underwent a pulmonary function test that revealed mild AFL with normal FEV1, DLCO at 39%  V/Q scan was discussed but never done.  ONO showed desats > now on O2 at night PSG not done  ROV 12/02/14 -- follow-up visit for history of mild obstructive lung disease (apparently asymptomatic), and pulmonary hypertension. Repeat echocardiogram performed 09/25/13 estimated PASP at 53 mmHg, down from 68. He has not had a VQ scan or a sleep study. He started oxygen at night - feels a bit better overall.  He remains active, maybe not to the level of our previous visit.   ROV 08/15/15 -- follow-up visit for mild obstructive lung disease and pulmonary hypertension.  He had a sleep study done 07/15/15 that showed moderate obstructive sleep apnea (AHI equals 16.2 per hour). He was titrated to an optimal pressure of 10 cm water.  He does state that he can fall asleep accidentally, naps occasionally. No issues with driving. His breathing has been doing well until summer heat. He is also having more allergy sx, is using loratadine prn..  He has never uses SABA.   ROV 11/29/15 -- follow-up visit for secondary pulmonary hypertension, obstructive sleep apnea, mild obstructive lung disease. At last visit we started him on CPAP 10 cm water. Some difficulty tolerating recently due to a sinus infxn. Has been treated with augmentin. He has been noted to have exertional desaturations after he bought an oximeter and checked himself with exertion. We confirmed that he does  desaturate today with walking.  He does believe that his exercise tolerance has decreased some. He has not had V/q scan or serologies. He does believe albuterol helped him.   ROV 02/03/16 --  Follow up for secondary PAH, OSA (treated), COPD. His eval for PAH has continued > serologies negative, ventilation perfusion scan was low probability on review today, PFT's done today that I have personally reviewed. These show mild obstruction without a bronchodilator response. His FEV1 is 2.78 L which is decreased from 3.03 L in 2013. He had severe diffusion defect with some evidence of mild restriction based on a decreased residual volume. As above he has documented hypoxemia with exertion. He has not been wearing his oxygen reliably with all walking. I encouraged him to do so  ROV 03/08/16 -- patient has history of secondary PAH, treated OSA, COPD. He has documented hypoxemia on exertion. He underwent transcranial Doppler study to look for shunting on 02/21/16. There was some suggestion of a small right to left intrapulmonary shunt as bubbles were transmitted after approximately 15 seconds.  His last echo was in 2014. 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 He was started on spiriva last visit, may have noticed some decrease in WOB. Interestingly he has noticed lower SpO2 on the spiriva, possibly due to increased shunting with bronchodilation.   Review of Systems  Constitutional: Negative for fever and unexpected weight change.  HENT: Negative for congestion, dental problem, ear pain, nosebleeds, postnasal drip, rhinorrhea, sinus pressure, sneezing, sore throat and trouble swallowing.   Eyes: Negative for redness and itching.  Respiratory: Positive for shortness of breath. Negative for cough, chest tightness and wheezing.   Cardiovascular: Negative for palpitations  and leg swelling.  Gastrointestinal: Negative for nausea and vomiting.  Genitourinary: Negative for dysuria.  Musculoskeletal: Negative for joint swelling.  Skin: Negative for rash.  Neurological: Negative for headaches.  Hematological: Does not bruise/bleed easily.  Psychiatric/Behavioral: Negative for dysphoric mood. The patient is not nervous/anxious.       Objective:   Physical Exam Filed Vitals:   03/08/16 1431  BP: 140/80  Pulse: 100  Height: 5\' 10"  (1.778 m)  Weight: 213 lb (96.616 kg)  SpO2: 90%   Gen: Pleasant, well-nourished, in no distress,  normal affect  ENT: No lesions,  mouth clear,  oropharynx clear, no postnasal drip  Neck: No JVD, no TMG, no carotid bruits  Lungs: No use of accessory muscles,  clear without rales or rhonchi  Cardiovascular: RRR, heart sounds normal, no murmur or gallops, no peripheral edema  Musculoskeletal: No deformities, no cyanosis or clubbing  Neuro: alert, non focal  Skin: Warm, no lesions or rashes     Assessment & Plan:  Pulmonary hypertension Based on echocardiogram last done in 2014. I believe he needs a repeat echocardiogram to confirm pulmonary pressures. We will perform this with a bubble study in order to look for possible shunt physiology although I suspect that his right to left shunt is intrapulmonary given the timing seen on his transcranial Dopplers. He has a right to left shunt that appears to be intrapulmonary given his transcranial Dopplers. Etiologies could be intrapulmonary AVM or less likely an intracardiac shunt. I believe he needs a CT with contrast to look for AVMs.   Obstructive sleep apnea On therapy. We'll continue his CPAP.  COPD (chronic obstructive pulmonary disease) (HCC) Pulmonary function testing shows mixed disease with an FEV1 in the normal range consistent with probable mild obstruction. Interestingly his CT scan of the chest that was just done shows significant emphysema and absence of large  scale airflow limitation. He feels that he has probably benefit from Spiriva. I would like to continue this even though he's also made the observation that he feels that his saturations may be lower on the medication. This is possible that he is having an increase in shunting due to dilation.

## 2016-03-08 NOTE — Assessment & Plan Note (Signed)
On therapy. We'll continue his CPAP.

## 2016-03-08 NOTE — Assessment & Plan Note (Signed)
Pulmonary function testing shows mixed disease with an FEV1 in the normal range consistent with probable mild obstruction. Interestingly his CT scan of the chest that was just done shows significant emphysema and absence of large scale airflow limitation. He feels that he has probably benefit from Spiriva. I would like to continue this even though he's also made the observation that he feels that his saturations may be lower on the medication. This is possible that he is having an increase in shunting due to dilation.

## 2016-03-23 ENCOUNTER — Other Ambulatory Visit: Payer: Self-pay | Admitting: Emergency Medicine

## 2016-03-23 ENCOUNTER — Other Ambulatory Visit: Payer: Self-pay

## 2016-03-23 ENCOUNTER — Ambulatory Visit (HOSPITAL_COMMUNITY): Payer: Medicare Other | Attending: Cardiovascular Disease

## 2016-03-23 ENCOUNTER — Ambulatory Visit (INDEPENDENT_AMBULATORY_CARE_PROVIDER_SITE_OTHER)
Admission: RE | Admit: 2016-03-23 | Discharge: 2016-03-23 | Disposition: A | Discharge status: Home / Self Care | Payer: Medicare Other | Source: Ambulatory Visit | Attending: Emergency Medicine | Admitting: Emergency Medicine

## 2016-03-23 DIAGNOSIS — R0902 Hypoxemia: Secondary | ICD-10-CM

## 2016-03-23 DIAGNOSIS — J449 Chronic obstructive pulmonary disease, unspecified: Secondary | ICD-10-CM | POA: Diagnosis not present

## 2016-04-24 ENCOUNTER — Encounter: Payer: Self-pay | Admitting: Emergency Medicine

## 2016-04-24 ENCOUNTER — Ambulatory Visit (INDEPENDENT_AMBULATORY_CARE_PROVIDER_SITE_OTHER): Payer: Medicare Other | Admitting: Emergency Medicine

## 2016-04-24 VITALS — BP 146/74 | HR 94 | Ht 70.0 in | Wt 217.0 lb

## 2016-04-24 DIAGNOSIS — I272 Other secondary pulmonary hypertension: Secondary | ICD-10-CM | POA: Diagnosis not present

## 2016-04-24 DIAGNOSIS — J309 Allergic rhinitis, unspecified: Secondary | ICD-10-CM | POA: Diagnosis not present

## 2016-04-24 DIAGNOSIS — J449 Chronic obstructive pulmonary disease, unspecified: Secondary | ICD-10-CM | POA: Diagnosis not present

## 2016-04-24 DIAGNOSIS — G4733 Obstructive sleep apnea (adult) (pediatric): Secondary | ICD-10-CM | POA: Diagnosis not present

## 2016-04-24 NOTE — Assessment & Plan Note (Addendum)
Good compliance and experiencing benefit from the therapy. Continue CPAP every night with oxygen bled in

## 2016-04-24 NOTE — Progress Notes (Addendum)
Subjective:    Patient ID: Derrick Petersen., male    DOB: 1941/03/16, 75 y.o.   MRN: [id number]  HPI 75 yo man, hx HTN, polyneuropathy, hyperglycemia.  Former smoker, hx OSA (? CPAP) w slowly progressive dyspnea. Referred for PAH eval. Dr Sherene Sires changed him from lisinopril to benicar, had trouble with this > HTN and some CP. Now back on lisinopril. He is very active, rides stationary bike.   6 min walk 01/16/13 >> no desat, 483m TTE 11/2012 >> PASP ~ 68.  He underwent a pulmonary function test that revealed mild AFL with normal FEV1, DLCO at 39%  V/Q scan was discussed but never done.  ONO showed desats > now on O2 at night PSG not done  ROV 12/02/14 -- follow-up visit for history of mild obstructive lung disease (apparently asymptomatic), and pulmonary hypertension. Repeat echocardiogram performed 09/25/13 estimated PASP at 53 mmHg, down from 68. He has not had a VQ scan or a sleep study. He started oxygen at night - feels a bit better overall.  He remains active, maybe not to the level of our previous visit.   ROV 08/15/15 -- follow-up visit for mild obstructive lung disease and pulmonary hypertension.  He had a sleep study done 07/15/15 that showed moderate obstructive sleep apnea (AHI equals 16.2 per hour). He was titrated to an optimal pressure of 10 cm water.  He does state that he can fall asleep accidentally, naps occasionally. No issues with driving. His breathing has been doing well until summer heat. He is also having more allergy sx, is using loratadine prn..  He has never uses SABA.   ROV 11/29/15 -- follow-up visit for secondary pulmonary hypertension, obstructive sleep apnea, mild obstructive lung disease. At last visit we started him on CPAP 10 cm water. Some difficulty tolerating recently due to a sinus infxn. Has been treated with augmentin. He has been noted to have exertional desaturations after he bought an oximeter and checked himself with exertion. We confirmed that he does  desaturate today with walking.  He does believe that his exercise tolerance has decreased some. He has not had V/q scan or serologies. He does believe albuterol helped him.   ROV 02/03/16 --  Follow up for secondary PAH, OSA (treated), COPD. His eval for PAH has continued > serologies negative, ventilation perfusion scan was low probability on review today, PFT's done today that I have personally reviewed. These show mild obstruction without a bronchodilator response. His FEV1 is 2.78 L which is decreased from 3.03 L in 2013. He had severe diffusion defect with some evidence of mild restriction based on a decreased residual volume. As above he has documented hypoxemia with exertion. He has not been wearing his oxygen reliably with all walking. I encouraged him to do so  ROV 03/08/16 -- patient has history of secondary PAH, treated OSA, COPD. He has documented hypoxemia on exertion. He underwent transcranial Doppler study to look for shunting on 02/21/16. There was some suggestion of a small right to left intrapulmonary shunt as bubbles were transmitted after approximately 15 seconds.  His last echo was in 2014. 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 He was started on spiriva last visit, may have noticed some decrease in WOB. Interestingly he has noticed lower SpO2 on the spiriva, possibly due to increased shunting with bronchodilation.   ROV 04/24/16 -- follow-up  visit for history of secondary pulmonary hypertension in the setting of exertional hypoxemia, COPD, treated obstructive sleep apnea. He also has some mild bibasilar interstitial disease and biapical emphysematous changes on CT scan of the chest.  A repeat CT scan on 3/24 with contrast were reviewed by me today. I do not see any evidence of pulmonary embolism, arteriovenous malformation. An echocardiogram  was also performed on 03/23/16. This showed a normal left ventricle with some mild increase in wall thickness, normal systolic function. There was some evidence for grade 1 diastolic dysfunction. No defect or PFO was identified on the bubble study. His autoimmune panel done on 11/29/15 is all negative. He feels about the same - he is benefiting from using the O2, uses it with exertion 3-4L/min pulsed, and at night bled into CPAP.  At last visit we stopped spiriva - unclear whether he missed it.                  Addendum (added 05/09/16) - I neglected to document our conversation and review of compliance with his CPAP.  He states that he is using every night. His download information supports that he is using reliably for more than 4h a night. He is benefiting, feels more rested, has more energy. All supporting good compliance and benefit from the therapy.                                                                                                                                  Review of Systems  Constitutional: Negative for fever and unexpected weight change.  HENT: Negative for congestion, dental problem, ear pain, nosebleeds, postnasal drip, rhinorrhea, sinus pressure, sneezing, sore throat and trouble swallowing.   Eyes: Negative for redness and itching.  Respiratory: Positive for shortness of breath. Negative for cough, chest tightness and wheezing.   Cardiovascular: Negative for palpitations and leg swelling.  Gastrointestinal: Negative for nausea and vomiting.  Genitourinary: Negative for dysuria.  Musculoskeletal: Negative for joint swelling.  Skin: Negative for rash.  Neurological: Negative for headaches.  Hematological: Does not bruise/bleed easily.  Psychiatric/Behavioral: Negative for dysphoric mood. The patient is not nervous/anxious.       Objective:   Physical Exam Filed Vitals:   04/24/16 1503 04/24/16 1504  BP:  146/74  Pulse:  94  Height:  (1.778 m)   Weight:  98.431 kg (217 lb)   SpO2:  90%   Gen: Pleasant, well-nourished, in no distress,  normal affect  ENT: No lesions,  mouth clear,  oropharynx clear, no postnasal drip  Neck: No JVD, no TMG, no carotid bruits  Lungs: No use of accessory muscles,  clear without rales or rhonchi  Cardiovascular: RRR, heart sounds normal, no murmur or gallops, no peripheral edema  Musculoskeletal: No deformities, no cyanosis or clubbing  Neuro: alert, non focal  Skin: Warm, no lesions or rashes     Assessment & Plan:  COPD (chronic  obstructive pulmonary disease) (HCC) After extensive workup CT scan and PFT are most consistent with COPD with a strong emphysematous phenotype. He has extensive emphysematous injury to his bilateral apices. I believe he has only minimal obstruction but significant VQ mismatch. This would explain his profound hypoxemia.  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 Continue CPAP every night with oxygen bled in  Allergic rhinitis Continue current regimen   Levy Pupaobert Chistian Kasler, MD, PhD 05/09/2016, 10:15 AM [facility] [phone] or if no answer [phone]

## 2016-04-24 NOTE — Assessment & Plan Note (Signed)
Secondary to severe lung disease, emphysema

## 2016-04-24 NOTE — Assessment & Plan Note (Signed)
Continue current regimen

## 2016-04-24 NOTE — Assessment & Plan Note (Signed)
After extensive workup CT scan and PFT are most consistent with COPD with a strong emphysematous phenotype. He has extensive emphysematous injury to his bilateral apices. I believe he has only minimal obstruction but significant VQ mismatch. This would explain his profound hypoxemia.

## 2016-04-24 NOTE — Patient Instructions (Signed)
We will start a new medication called Stiolto 2 puffs once a day. Please call our office to let Derrick Petersen know if you benefited from this medication in one month. If so then we will send a prescription to your pharmacy to continue it. Please continue to keep albuterol available to use if needed for shortness of breath Please continue your oxygen at 3-4 L with exertion and also bled into your CPAP while sleeping Continue CPAP every night Follow with Dr Delton CoombesByrum in 6 months or sooner if you have any problems

## 2016-05-02 ENCOUNTER — Telehealth: Payer: Self-pay | Admitting: Emergency Medicine

## 2016-05-02 DIAGNOSIS — J449 Chronic obstructive pulmonary disease, unspecified: Secondary | ICD-10-CM

## 2016-05-02 DIAGNOSIS — G4733 Obstructive sleep apnea (adult) (pediatric): Secondary | ICD-10-CM

## 2016-05-02 NOTE — Telephone Encounter (Signed)
Called and spoke to pt. Pt is requesting renewal of CPAP supplies, new order placed. Pt is also requesting a referral to pulmonary rehab in [place].   Dr. Delton CoombesByrum, please advise if ok to place order for pulmonary rehab. Thanks.

## 2016-05-02 NOTE — Telephone Encounter (Signed)
Yes please make the referral  

## 2016-05-02 NOTE — Telephone Encounter (Signed)
Called and spoke to pt. Informed him of the referral, order placed. Pt verbalized understanding and denied any further questions or concerns at this time.

## 2016-05-07 ENCOUNTER — Telehealth: Payer: Self-pay | Admitting: Emergency Medicine

## 2016-05-07 NOTE — Telephone Encounter (Signed)
Spoke with [employer]. States that the pt's CPAP supplies will not be shipped to due to his OV does not state that he is using and benefiting from the therapy. Pt's OV note will need to have an addendum stating this.  RB - please make this addendum. Thanks.

## 2016-05-07 NOTE — Telephone Encounter (Signed)
Spoke with pt. States that he has still not received his CPAP supplies. I called and spoke with Larita FifeLynn at [facility]. She states that the department who ships the CPAP supplies have all of the documents they need to ship supplies. Pt is aware that he should be getting his CPAP supplies. Nothing further was needed.

## 2016-05-09 NOTE — Telephone Encounter (Signed)
You can make an addendum by right clicking on the encounter and choosing Edit/Adend Note.

## 2016-05-09 NOTE — Telephone Encounter (Signed)
I can't figure out how to make an addendum to that note. Will need to get someone to teach us or else do a new note.

## 2016-05-09 NOTE — Telephone Encounter (Signed)
Thanks, I signed it.

## 2016-05-16 ENCOUNTER — Telehealth: Payer: Self-pay | Admitting: Emergency Medicine

## 2016-05-16 NOTE — Telephone Encounter (Signed)
lmtcb X1 for [facility] with [facility].

## 2016-05-17 ENCOUNTER — Telehealth: Payer: Self-pay | Admitting: Emergency Medicine

## 2016-05-17 NOTE — Telephone Encounter (Signed)
Adelina MingsKelsey returned call, CB is [phone] ext [phone].

## 2016-05-17 NOTE — Telephone Encounter (Signed)
lmomtcb for [employer]

## 2016-05-17 NOTE — Telephone Encounter (Signed)
LM for Kelsey at [facility] x 2

## 2016-05-17 NOTE — Telephone Encounter (Signed)
[facility]

## 2016-05-17 NOTE — Telephone Encounter (Signed)
Will close message - Refer to message from 05/16/16

## 2016-05-18 NOTE — Telephone Encounter (Signed)
Spoke with Derrick Petersen. She was trying to get in touch with Derrick Petersen. Advised her that Derrick Petersen does not work with our office. Derrick Petersen will contact the local [facility] to speak with Derrick Petersen. Nothing further was needed.

## 2016-05-22 ENCOUNTER — Telehealth: Payer: Self-pay

## 2016-05-22 NOTE — Telephone Encounter (Signed)
Patient has a CPE in Oct he would like to come in a few days before to have his labs done. Could he do that and if so could you put the order in. THank you.

## 2016-05-24 ENCOUNTER — Telehealth: Payer: Self-pay | Admitting: Emergency Medicine

## 2016-05-24 NOTE — Telephone Encounter (Signed)
Spoke with the pt  He states after he started [employer]iolto he developed laryngitis He stopped med for a few days, tried again and same thing happened  He has stopped med and feels okay now  He wonders if there is something else he should try  He also wants to know if he could try ventolin instead of proair respiclick to see if this works better  Pt aware RB out of the office and okay with waiting until his return for response  Please advise, thanks!

## 2016-05-29 MED ORDER — ALBUTEROL SULFATE HFA 108 (90 BASE) MCG/ACT IN AERS: 2.0000 | INHALATION_SPRAY | Freq: Four times a day (QID) | RESPIRATORY_TRACT | Status: DC | PRN

## 2016-05-29 NOTE — Telephone Encounter (Signed)
Let him know that laryngeal irritation is a known side effect of these inhalers, but that we may be able to find one that is less bothersome. Have him try Anoro 1 puff daily to see if he prefers and benefits. Also, Ok with me to change respiclick to [facility].

## 2016-05-29 NOTE — Telephone Encounter (Signed)
Called spoke with pt. Reviewed recs and verified pharmacy as [facility]. Informed him that Anoro samples have been placed at the front for pick up. He voiced understanding and had no further questions. Anoro sample has been placed at the front office for pick up. Nothing further needed.

## 2016-05-29 NOTE — Telephone Encounter (Signed)
Dr. Byrum, please advise. 

## 2016-06-11 ENCOUNTER — Telehealth: Payer: Self-pay | Admitting: Emergency Medicine

## 2016-06-11 NOTE — Telephone Encounter (Signed)
Spoke with the pt and notified of recs per RB He verbalized understanding  Nothing further needed  Will call back with update

## 2016-06-11 NOTE — Telephone Encounter (Signed)
Not sure whether paraesthesias are related to anoro. Best way to figure this out would be to stop it and see if the problems goes away. Ask him if he wants to try stopping the med temporarily

## 2016-06-11 NOTE — Telephone Encounter (Signed)
Spoke with the pt  He states that that Anoro has helped with his breathing minimally and has not had any throat irritation However, he has noticed tingling in his fingertips and this is something new since starting med  He states that this is not bothersome and unsure if it is even coming from taking anoro  Please advise thanks!

## 2016-07-10 ENCOUNTER — Telehealth: Payer: Self-pay | Admitting: Emergency Medicine

## 2016-07-10 NOTE — Telephone Encounter (Signed)
Called and spoke with pt and he stated that he wanted to see if RB wanted him to try another medication. Pt stated that he has tried the following meds:  spiriva---did ok for about 1 month, then just not able to take stiolto--this caused him to have laryngitis anoro--this did good, but had tingling in his fingers and was told to stop this medication.  He stated that he has been off the inhalers, except the occasional use of albuterol PRN.  He said that he has been in pulmonary rehab [facility] and he will have them fax over the notes on this.  He has an appt with RB on Oct 26.  Please advise RB if you want to try him on another med.  Thanks  No Known Allergies

## 2016-07-11 MED ORDER — BUDESONIDE-FORMOTEROL FUMARATE 160-4.5 MCG/ACT IN AERO: 2.0000 | INHALATION_SPRAY | Freq: Two times a day (BID) | RESPIRATORY_TRACT | Status: DC

## 2016-07-11 NOTE — Telephone Encounter (Signed)
Please have him start Symbicort 160, 2 puffs bid

## 2016-07-11 NOTE — Telephone Encounter (Signed)
Spoke with pt, aware of recs.  Samples left up front for pt.  He will call back after being medication to update us on how it works for him.  Nothing further needed at this time.

## 2016-09-05 ENCOUNTER — Telehealth: Payer: Self-pay | Admitting: Emergency Medicine

## 2016-09-05 MED ORDER — DOXYCYCLINE HYCLATE 100 MG PO TABS: 100.0000 mg | ORAL_TABLET | Freq: Two times a day (BID) | ORAL | 0 refills | Status: DC

## 2016-09-05 NOTE — Telephone Encounter (Signed)
Called and spoke with pt and he is aware of RB recs. Nothing further is needed.  

## 2016-09-05 NOTE — Telephone Encounter (Signed)
Spoke with pt, thinks he has bronchitis- c/o intermittent prod cough with clear/white mucus, runny nose, fatigue X1 week.  Pt states his girlfriend started having these same symptoms X2 wks ago, was recently placed on augmentin by her PCP.  Pt does not want this to worsen, would rather "nip it in the bud".   Denies chest pain, fever, PND.  Pt has been drinking lots of water, taking aspirin and otc tussin.   Pt uses [facility] in [place] on [address]    RB please advise on recs.  Thanks!

## 2016-09-05 NOTE — Telephone Encounter (Signed)
Please have him take doxycycline 100mg  bid x 7 days. Call our office if not improving

## 2016-09-14 ENCOUNTER — Telehealth: Payer: Self-pay | Admitting: Emergency Medicine

## 2016-09-14 MED ORDER — DOXYCYCLINE HYCLATE 100 MG PO TABS: 100.0000 mg | ORAL_TABLET | Freq: Two times a day (BID) | ORAL | 0 refills | Status: DC

## 2016-09-14 NOTE — Telephone Encounter (Signed)
Spoke with pt, states he finished his doxycycline (7 days) on Wednesday, since yesterday cough has returned- cough is prod with clear mucus, notes runny nose. Denies chest pain, PND, fever, chills.  Pt requesting further recs.    Pt uses [facility] [place].    Sending to DOD in RB's absence- MW please advise on recs.  Thanks!   Instructions  Patient Instructions   We will start a new medication called Stiolto 2 puffs once a day. Please call our office to let us know if you benefited from this medication in one month. If so then we will send a prescription to your pharmacy to continue it. Please continue to keep albuterol available to use if needed for shortness of breath Please continue your oxygen at 3-4 L with exertion and also bled into your CPAP while sleeping Continue CPAP every night Follow with Dr Delton CoombesByrum in 6 months or sooner if you have any problems

## 2016-09-14 NOTE — Telephone Encounter (Signed)
Called spoke with patient and advised of MW's recs as stated below Pt is aware to contact the office for visit if he is not back to baseline after this round of doxycycline Rx sent to verified pharmacy Nothing further needed; will sign off

## 2016-09-14 NOTE — Telephone Encounter (Signed)
Ok to refill the doxy x one then ov if not back to baseline

## 2016-10-18 ENCOUNTER — Telehealth: Payer: Self-pay | Admitting: Internal Medicine

## 2016-10-18 DIAGNOSIS — Z0000 Encounter for general adult medical examination without abnormal findings: Secondary | ICD-10-CM

## 2016-10-18 NOTE — Telephone Encounter (Signed)
Patient is requesting labs to be entered before CPE on 10/26.  Please follow up in regard.

## 2016-10-18 NOTE — Telephone Encounter (Signed)
Lab orders placed.  

## 2016-10-22 ENCOUNTER — Other Ambulatory Visit (INDEPENDENT_AMBULATORY_CARE_PROVIDER_SITE_OTHER): Payer: Medicare Other

## 2016-10-22 DIAGNOSIS — R7989 Other specified abnormal findings of blood chemistry: Secondary | ICD-10-CM

## 2016-10-22 DIAGNOSIS — Z0000 Encounter for general adult medical examination without abnormal findings: Secondary | ICD-10-CM

## 2016-10-22 LAB — CBC
HEMATOCRIT: 48.5 % (ref 39.0–52.0)
Hemoglobin: 17 g/dL (ref 13.0–17.0)
MCHC: 35.1 g/dL (ref 30.0–36.0)
MCV: 91.7 fl (ref 78.0–100.0)
Platelets: 286 10*3/uL (ref 150.0–400.0)
RBC: 5.29 Mil/uL (ref 4.22–5.81)
RDW: 14.1 % (ref 11.5–15.5)
WBC: 11.9 10*3/uL — ABNORMAL HIGH (ref 4.0–10.5)

## 2016-10-22 LAB — LIPID PANEL
CHOLESTEROL: 174 mg/dL (ref 0–200)
HDL: 33.7 mg/dL — AB (ref >39.00)
NonHDL: 140.14
Total CHOL/HDL Ratio: 5
Triglycerides: 270 mg/dL — ABNORMAL HIGH (ref 0.0–149.0)
VLDL: 54 mg/dL — AB (ref 0.0–40.0)

## 2016-10-22 LAB — HEPATIC FUNCTION PANEL
ALT: 23 U/L (ref 0–53)
AST: 23 U/L (ref 0–37)
Albumin: 4.2 g/dL (ref 3.5–5.2)
Alkaline Phosphatase: 57 U/L (ref 39–117)
BILIRUBIN TOTAL: 0.8 mg/dL (ref 0.2–1.2)
Bilirubin, Direct: 0.2 mg/dL (ref 0.0–0.3)
TOTAL PROTEIN: 7.5 g/dL (ref 6.0–8.3)

## 2016-10-22 LAB — BASIC METABOLIC PANEL
BUN: 22 mg/dL (ref 6–23)
CO2: 26 mEq/L (ref 19–32)
Calcium: 9.8 mg/dL (ref 8.4–10.5)
Chloride: 101 mEq/L (ref 96–112)
Creatinine, Ser: 1.06 mg/dL (ref 0.40–1.50)
GFR: 72.48 mL/min (ref >60.00)
GLUCOSE: 102 mg/dL — AB (ref 70–99)
POTASSIUM: 4.8 meq/L (ref 3.5–5.1)
SODIUM: 135 meq/L (ref 135–145)

## 2016-10-22 LAB — TSH: TSH: 1.53 u[IU]/mL (ref 0.35–4.50)

## 2016-10-22 LAB — MICROALBUMIN / CREATININE URINE RATIO
Creatinine,U: 57.9 mg/dL
Microalb Creat Ratio: 1.2 mg/g (ref 0.0–30.0)
Microalb, Ur: <0.7 mg/dL (ref 0.0–1.9)

## 2016-10-22 LAB — LDL CHOLESTEROL, DIRECT: Direct LDL: 107 mg/dL

## 2016-10-22 LAB — PSA: PSA: 0.85 ng/mL (ref 0.10–4.00)

## 2016-10-22 LAB — HEMOGLOBIN A1C: Hgb A1c MFr Bld: 5.4 % (ref 4.6–6.5)

## 2016-10-25 ENCOUNTER — Encounter: Payer: Self-pay | Admitting: Internal Medicine

## 2016-10-25 ENCOUNTER — Ambulatory Visit (INDEPENDENT_AMBULATORY_CARE_PROVIDER_SITE_OTHER): Payer: Medicare Other | Admitting: Internal Medicine

## 2016-10-25 VITALS — BP 126/82 | HR 92 | Temp 97.6°F | Ht 70.0 in | Wt 215.0 lb

## 2016-10-25 DIAGNOSIS — Z0000 Encounter for general adult medical examination without abnormal findings: Secondary | ICD-10-CM

## 2016-10-25 DIAGNOSIS — M542 Cervicalgia: Secondary | ICD-10-CM | POA: Insufficient documentation

## 2016-10-25 MED ORDER — LISINOPRIL 20 MG PO TABS: 20.0000 mg | ORAL_TABLET | Freq: Every day | ORAL | 3 refills | Status: DC

## 2016-10-25 MED ORDER — HYDROCHLOROTHIAZIDE 25 MG PO TABS: 25.0000 mg | ORAL_TABLET | Freq: Every day | ORAL | 3 refills | Status: DC

## 2016-10-25 NOTE — Progress Notes (Signed)
Subjective:    Patient ID: Derrick Palau., male    DOB: 1941-06-13, 75 y.o.   MRN: [id number]  HPI Here for wellness and f/u;  Overall doing ok;  Pt denies Chest pain, worsening SOB, DOE, wheezing, orthopnea, PND, worsening LE edema, palpitations, dizziness or syncope.  Pt denies neurological change such as new headache, facial or extremity weakness.  Pt denies polydipsia, polyuria, or low sugar symptoms. Pt states overall good compliance with treatment and medications, good tolerability, and has been trying to follow appropriate diet.  Pt denies worsening depressive symptoms, suicidal ideation or panic. No fever, night sweats, wt loss, loss of appetite, or other constitutional symptoms.  Pt states good ability with ADL's, has low fall risk, home safety reviewed and adequate, no other significant changes in hearing or vision, and not active with exercise due to pain and chronic dyspnea  Has been to pulm rehab, plans to be more active, wants to get to 170  On home o2 5L [place] with exercise. Wt Readings from Last 3 Encounters:  10/25/16 215 lb (97.5 kg)  04/24/16 217 lb (98.4 kg)  03/08/16 213 lb (96.6 kg)  Already had flu shot 2 wks ago.  To see Dr Delton Coombes next month, No sure he wants to have colonoscopy due to risk  Seen per ortho in [place], films of neck and shoulder cw cervical DDD, pain improved for some time, then worse again recently x 1 mo, last seen x 3 yrs. Has been sleeping in a recliner with heating pad, and not improving. Has som radiation towards right shoulder but not arm. Past Medical History:  Diagnosis Date  . COLONIC POLYPS, HX OF 07/26/2009  . FATIGUE 07/26/2009  . GLUCOSE INTOLERANCE 07/26/2009  . HYPERLIPIDEMIA 07/26/2009  . HYPERTENSION 07/26/2009  . Impaired glucose tolerance 08/05/2011  . OTITIS EXTERNA 11/28/2009  . 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  reports that he quit smoking about 27 years ago. His smoking use included Cigarettes. He has a 102.00 pack-year smoking history. He has never used smokeless tobacco. He reports that he drinks about 1.8 oz of alcohol per week . He reports that he does not use drugs. family history includes Colon cancer in his father; Diabetes in his other; Hypertension in his father and mother. No Known Allergies Current Outpatient Prescriptions on File Prior to Visit  Medication Sig Dispense Refill  . albuterol (PROAIR HFA) 108 (90 Base) MCG/ACT inhaler Inhale 2 puffs into the lungs every 6 (six) hours as needed for wheezing or shortness of breath. 1 Inhaler 3  . aspirin 325 MG tablet Take 325 mg by mouth 2 (two) times daily as needed.    . B Complex-C (SUPER B COMPLEX PO) Take by mouth daily.      . budesonide-formoterol (SYMBICORT) 160-4.5 MCG/ACT inhaler Inhale 2 puffs into the lungs 2 (two) times daily. 2 Inhaler 0  . COD LIVER OIL PO Take by mouth daily.    Marland Kitchen glucosamine-chondroitin 500-400 MG tablet Take 2 tablets by mouth daily.     Marland Kitchen loratadine (CLARITIN) 10 MG tablet Take 10 mg by mouth daily.    . Magnesium 300 MG CAPS Take by mouth daily.      . Multiple Vitamin (MULTIVITAMIN) capsule Take 1 capsule by mouth daily.      . Selenium 200 MCG TABS Take 1 tablet by mouth daily.    Marland Kitchen  Albuterol Sulfate (PROAIR RESPICLICK) 108 (90 BASE) MCG/ACT AEPB Inhale 2 puffs into the lungs every 4 (four) hours as needed. (Patient not taking: Reported on 10/25/2016) 1 each 5  . doxycycline (VIBRA-TABS) 100 MG tablet Take 1 tablet (100 mg total) by mouth 2 (two) times daily. (Patient not taking: Reported on 10/25/2016) 14 tablet 0  . ferrous gluconate (FERGON) 325 MG tablet Take 325 mg by mouth daily as needed.     . Fish Oil-Cholecalciferol (FISH OIL + D3 PO) Take by mouth daily.    . niacin (NIASPAN) 500 MG CR tablet Take 1 tablet (500 mg total) by mouth at bedtime. (Patient not taking: Reported on  10/25/2016) 30 tablet 3  . tiotropium (SPIRIVA) 18 MCG inhalation capsule Place 1 capsule (18 mcg total) into inhaler and inhale daily. (Patient not taking: Reported on 10/25/2016) 30 capsule 6   No current facility-administered medications on file prior to visit.     Review of Systems Constitutional: Negative for increased diaphoresis, or other activity, appetite or siginficant weight change other than noted HENT: Negative for worsening hearing loss, ear pain, facial swelling, mouth sores and neck stiffness.   Eyes: Negative for other worsening pain, redness or visual disturbance.  Respiratory: Negative for choking or stridor Cardiovascular: Negative for other chest pain and palpitations.  Gastrointestinal: Negative for worsening diarrhea, blood in stool, or abdominal distention Genitourinary: Negative for hematuria, flank pain or change in urine volume.  Musculoskeletal: Negative for myalgias or other joint complaints.  Skin: Negative for other color change and wound or drainage.  Neurological: Negative for syncope and numbness. other than noted Hematological: Negative for adenopathy. or other swelling Psychiatric/Behavioral: Negative for hallucinations, SI, self-injury, decreased concentration or other worsening agitation.  All other system neg per pt    Objective:   Physical Exam BP 126/82   Pulse 92   Temp 97.6 F (36.4 C) (Oral)   Ht 5\' 10"  (1.778 m)   Wt 215 lb (97.5 kg)   SpO2 96%   BMI 30.85 kg/m  - on 5L [place] o2 at the time after walking in door VS noted,  Constitutional: Pt is oriented to person, place, and time. Appears well-developed and well-nourished, in no significant distress Head: Normocephalic and atraumatic  Eyes: Conjunctivae and EOM are normal. Pupils are equal, round, and reactive to light Right Ear: External ear normal.  Left Ear: External ear normal Nose: Nose normal.  Mouth/Throat: Oropharynx is clear and moist  Neck: Normal range of motion. Neck  supple. No JVD present. No tracheal deviation present or significant neck LA or mass Cardiovascular: Normal rate, regular rhythm, normal heart sounds and intact distal pulses.   Pulmonary/Chest: Effort normal and breath sounds decreased without rales or wheezing  Abdominal: Soft. Bowel sounds are normal. NT. No HSM  Musculoskeletal: Normal range of motion. Exhibits no edema Lymphadenopathy: Has no cervical adenopathy.  Neurological: Pt is alert and oriented to person, place, and time. Pt has normal reflexes. No cranial nerve deficit. Motor grossly intact Skin: Skin is warm and dry. No rash noted or new ulcers Psychiatric:  Has normal mood and affect. Behavior is normal.   ecg today I have personally interpreted Sinus  Rhythm  -Right bundle branch block.     Assessment & Plan:

## 2016-10-25 NOTE — Progress Notes (Signed)
Pre visit review using our clinic review tool, if applicable. No additional management support is needed unless otherwise documented below in the visit note. 

## 2016-10-25 NOTE — Patient Instructions (Addendum)
Please remember to ask Dr Delton CoombesByrum about the advisability of colonoscopy  [facility]  Please continue all other medications as before, and refills have been done if requested.  Please have the pharmacy call with any other refills you may need.  Please continue your efforts at being more active, low cholesterol diet, and weight control.  You are otherwise up to date with prevention measures today.  Please keep your appointments with your specialists as you may have planned  You will be contacted regarding the referral for: Dr Katrinka BlazingSmith (and you can make appt as you leave today as well)  Please return in 1 year for your yearly visit, or sooner if needed, with Lab testing done 3-5 days before

## 2016-10-27 NOTE — Assessment & Plan Note (Signed)
Ok for referral to Dr Smith/[facility],  to f/u any worsening symptoms or concerns

## 2016-10-27 NOTE — Assessment & Plan Note (Signed)

## 2016-10-29 ENCOUNTER — Encounter: Payer: Self-pay | Admitting: Emergency Medicine

## 2016-10-29 ENCOUNTER — Ambulatory Visit (INDEPENDENT_AMBULATORY_CARE_PROVIDER_SITE_OTHER): Payer: Medicare Other | Admitting: Emergency Medicine

## 2016-10-29 DIAGNOSIS — J449 Chronic obstructive pulmonary disease, unspecified: Secondary | ICD-10-CM

## 2016-10-29 DIAGNOSIS — G4733 Obstructive sleep apnea (adult) (pediatric): Secondary | ICD-10-CM

## 2016-10-29 MED ORDER — GLYCOPYRROLATE 15.6 MCG IN CAPS: 1.0000 | ORAL_CAPSULE | Freq: Two times a day (BID) | RESPIRATORY_TRACT | 5 refills | Status: DC

## 2016-10-29 NOTE — Assessment & Plan Note (Signed)
He is clearly compliant and is clinically benefiting from CPAP. Will continue on same settings.

## 2016-10-29 NOTE — Addendum Note (Signed)
Addended by: Jaynee EaglesLEMONS, [employer] C on: 10/29/2016 03:34 PM   Modules accepted: Orders

## 2016-10-29 NOTE — Progress Notes (Signed)
Subjective:    Patient ID: Derrick PalauEarl Nase Jr., male    DOB: 10/19/1941, 75 y.o.   MRN: [id number]  HPI 75 yo man, hx HTN, polyneuropathy, hyperglycemia.  Former smoker, hx OSA (? CPAP) w slowly progressive dyspnea. Referred for PAH eval. Dr Sherene SiresWert changed him from lisinopril to benicar, had trouble with this > HTN and some CP. Now back on lisinopril. He is very active, rides stationary bike.   6 min walk 01/16/13 >> no desat, 43128m TTE 11/2012 >> PASP ~ 68.  He underwent a pulmonary function test that revealed mild AFL with normal FEV1, DLCO at 39%  V/Q scan was discussed but never done.  ONO showed desats > now on O2 at night PSG not done   ROV 02/03/16 --  Follow up for secondary PAH, OSA (treated), COPD. His eval for PAH has continued > serologies negative, ventilation perfusion scan was low probability on review today, PFT's done today that I have personally reviewed. These show mild obstruction without a bronchodilator response. His FEV1 is 2.78 L which is decreased from 3.03 L in 2013. He had severe diffusion defect with some evidence of mild restriction based on a decreased residual volume. As above he has documented hypoxemia with exertion. He has not been wearing his oxygen reliably with all walking. I encouraged him to do so  ROV 03/08/16 -- patient has history of secondary PAH, treated OSA, COPD. He has documented hypoxemia on exertion. He underwent transcranial Doppler study to look for shunting on 02/21/16. There was some suggestion of a small right to left intrapulmonary shunt as bubbles were transmitted after approximately 15 seconds.  His last echo was in 2014. 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 He was started on spiriva last visit, may have noticed some decrease in WOB. Interestingly he has noticed lower SpO2 on the spiriva, possibly  due to increased shunting with bronchodilation.   ROV 04/24/16 -- follow-up visit for history of secondary pulmonary hypertension in the setting of exertional hypoxemia, COPD, treated obstructive sleep apnea. He also has some mild bibasilar interstitial disease and biapical emphysematous changes on CT scan of the chest.  A repeat CT scan on 3/24 with contrast were reviewed by me today. I do not see any evidence of pulmonary embolism, arteriovenous malformation. An echocardiogram was also performed on 03/23/16. This showed a normal left ventricle with some mild increase in wall thickness, normal systolic function. There was some evidence for grade 1 diastolic dysfunction. No defect or PFO was identified on the bubble study. His autoimmune panel done on 11/29/15 is all negative. He feels about the same - he is benefiting from using the O2, uses it with exertion 3-4L/min pulsed, and at night bled into CPAP.  At last visit we stopped spiriva - unclear whether he missed it.                  Addendum (added 05/09/16) - I neglected to document our conversation and review of compliance with his CPAP.  He states that he is using every night. His download information supports that he is using reliably for more than 4h a night. He is benefiting, feels more rested, has more energy. All supporting good compliance and benefit from the therapy.                    ROV 10/29/16 --  this follow-up visit for patient with a history of COPD, a productive sleep apnea on CPAP, and secondary pulmonary hypertension. He also has a history of some mild bibasilar interstitial disease and significant emphysema  on CT scan of the chest.  Since our last visit he had a URI and then a probable bronchitis. Improved with doxy.  He does still have some hoarseness, but overall is doing better.  We tried him on Spiriva, didn't tolerate due to "funny feeling in his lungs" so went to stiolto, caused some UA hoarseness. He then tried Anoro but he some  upper extremity tingling. He then tried symbicort but stopped it due to throat irritation and hoarseness.  He is on loratdine.  He has done pulm rehab at [facility].                                                                                                                                                                                                                                         Review of Systems  Constitutional: Negative for fever and unexpected weight change.  HENT: Negative for congestion, dental problem, ear pain, nosebleeds, postnasal drip, rhinorrhea, sinus pressure, sneezing, sore throat and trouble swallowing.   Eyes: Negative for redness and itching.  Respiratory: Positive for shortness of breath. Negative for cough, chest tightness and wheezing.   Cardiovascular: Negative for palpitations and leg swelling.  Gastrointestinal: Negative for nausea and vomiting.  Genitourinary: Negative for dysuria.  Musculoskeletal: Negative for joint swelling.  Skin: Negative for rash.  Neurological: Negative for headaches.  Hematological: Does not bruise/bleed easily.  Psychiatric/Behavioral: Negative for dysphoric mood. The patient is not nervous/anxious.       Objective:   Physical Exam Vitals:   10/29/16 1444  BP: 138/80  BP Location: Left Arm  Cuff Size: Normal  Pulse: (!) 101  SpO2: 92%   Gen: Pleasant, well-nourished, in no distress,  normal affect  ENT: No lesions,  mouth clear,  oropharynx clear, no postnasal drip  Neck: No JVD, no TMG, no carotid bruits  Lungs: No use of accessory muscles,  clear without rales or rhonchi  Cardiovascular: RRR, heart sounds normal, no murmur or gallops, no peripheral edema  Musculoskeletal: No deformities, no cyanosis or clubbing  Neuro: alert, non focal  Skin: Warm, no lesions or rashes     Assessment & Plan:  COPD (chronic obstructive pulmonary disease) (HCC) We  will try starting Florencia ReasonsSeebri one inhalation twice a day Take  albuterol 2 puffs up to every 4 hours if needed for shortness of breath.  Please continue your oxygen with goal Spo2 ~ 92% Follow with Dr Delton CoombesByrum in 3 months or sooner if you have any problems.  Obstructive sleep apnea He is clearly compliant and is clinically benefiting from CPAP. Will continue on same settings.   Levy Pupaobert Zaydan Papesh, MD, PhD 10/29/2016, 3:30 PM [facility] [phone] or if no answer [phone]

## 2016-10-29 NOTE — Patient Instructions (Signed)
We will try starting Derrick Petersen one inhalation twice a day Take albuterol 2 puffs up to every 4 hours if needed for shortness of breath.  Please continue your oxygen with goal Spo2 ~ 92% Continue CPAP every night Follow with Dr Delton CoombesByrum in 3 months or sooner if you have any problems.

## 2016-10-29 NOTE — Assessment & Plan Note (Signed)
We will try starting Derrick Petersen one inhalation twice a day Take albuterol 2 puffs up to every 4 hours if needed for shortness of breath.  Please continue your oxygen with goal Spo2 ~ 92% Follow with Dr Delton CoombesByrum in 3 months or sooner if you have any problems.

## 2016-11-01 ENCOUNTER — Telehealth: Payer: Self-pay

## 2016-11-01 NOTE — Telephone Encounter (Signed)
[facility] PA [facility] from [facility] in [place] for seebri [employer] Spoke with EdenJenny with [facility] and started PA.  Ref # [id number]  Will hold in triage until we receive response.

## 2016-11-05 NOTE — Progress Notes (Signed)
Derrick Petersen D.O. [facility] [address] [place], [place] [address] Phone: [phone] Subjective:    I'm seeing this patient by the request  of:  Oliver BarreJames John, MD   CC: Neck pain   BJY:NWGNFAOZHYHPI:Subjective  Derrick Palauarl Eve Jr. is a 75 y.o. male coming in with complaint of neck pain. Seems to be more on the right side. Patient has seen another specialists for this and [place]. Patient has had x-rays previously states mostly of the neck and shoulder. Has been diagnosed with cervical degenerative disc disease. Patient states that were recently though over the course last 2 months worsening pain. Less had an exacerbation length this greater than 3 years ago. Patient states pain is been severe that he has had to sleep in a recliner with a heating pad. Mild radiation towards the right shoulder. Patient states He was doing quite well for some time but now unfortunate is having worsening pain again. An states that it is keeping him up at night. Patient states that no significant radiation down the arm. Patient states though that it is unfortunately affecting daily activities and especially his sleep. Has tried over-the-counter medication with very minimal benefit.    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 This is independent living visualized by me.  Past Medical History:  Diagnosis Date  . COLONIC POLYPS, HX OF 07/26/2009  . FATIGUE 07/26/2009  . GLUCOSE INTOLERANCE 07/26/2009  . HYPERLIPIDEMIA 07/26/2009  . HYPERTENSION 07/26/2009  . Impaired glucose tolerance 08/05/2011  . OTITIS EXTERNA 11/28/2009  . 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 Social History   Social History  . Marital status: Single    Spouse name: N/A  . Number of children: 0  . Years of  education: N/A   Occupational History  . retired [employer]     Social History Main Topics  . Smoking status: Former Smoker    Packs/day: 3.00    Years: 34.00    Types: Cigarettes    Quit date: 12/31/1988  . Smokeless tobacco: Never Used  . Alcohol use 1.8 oz/week    3 Cans of beer per week     Comment: occasional  . Drug use: No  . Sexual activity: Not Asked   Other Topics Concern  . 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   No Known Allergies Family History  Problem Relation Age of Onset  . Hypertension Mother   . Diabetes Other   . Colon cancer Father   . Hypertension Father     Past medical history, social, surgical and family history all reviewed in electronic medical record.  No pertanent information unless stated regarding to the chief complaint.   Review of Systems:Review of systems updated and as accurate as of 11/06/16  No headache, visual changes, nausea, vomiting, diarrhea, constipation, dizziness, abdominal pain, skin rash, fevers, chills, night sweats, weight loss, swollen lymph nodes, body aches, joint swelling, muscle aches, chest pain, shortness of breath, mood changes.   Objective  Blood pressure 140/80, pulse (!) 103, height 5\' 10"  (1.778 m), weight 220 lb (99.8 kg), SpO2 95 %. Systems examined below as of 11/06/16   General: No apparent distress alert and oriented x3 mood and affect normal, dressed appropriately.  HEENT: Pupils equal,  extraocular movements intact  Respiratory: Does appear short of breath at baseline and patient is wearing oxygen Cardiovascular: No lower extremity edema, non tender, no erythema  Skin: Warm dry intact with no signs of infection or rash on extremities or on axial skeleton.  Abdomen: Soft nontender  Neuro: Cranial nerves II through XII are intact, neurovascularly intact in all extremities with 2+ DTRs and 2+ pulses.  Lymph: No lymphadenopathy of posterior or anterior cervical chain  or axillae bilaterally.  Gait broad-based gait noted MSK:  Non tender with full range of motion and good stability and symmetric strength and tone of shoulders, elbows, wrist, hip, knee and ankles bilaterally. Arthritic changes of multiple joints Neck: Severe increase in lordosis of the cervical spine No palpable stepoffs. Negative Spurling's maneuver. Significant decrease in range of motion lacking the last 10 of extension as well as the last 5 of flexion. Patient has minimal sidebending bilaterally. Rotation of 20 bilaterally. Crepitus noted. Grip strength and sensation normal in bilateral hands Patient though does seem to have some mild weakness in the C8 distribution No sensory change to C4 to T1 Reflexes normal  Procedure note 97110; 15 minutes spent for Therapeutic exercises as stated in above notes.  This included exercises focusing on stretching, strengthening, with significant focus on eccentric aspects.     Basic scapular stabilization to include adduction and depression of scapula Scaption, focusing on proper movement and good control Rows with theraband  Proper technique shown and discussed handout in great detail with ATC.  All questions were discussed and answered.       Impression and Recommendations:     This case required medical decision making of moderate complexity.      Note: This dictation was prepared with Dragon dictation along with smaller phrase technology. Any transcriptional errors that result from this process are unintentional.

## 2016-11-06 ENCOUNTER — Encounter: Payer: Self-pay | Admitting: Family Medicine

## 2016-11-06 ENCOUNTER — Ambulatory Visit (INDEPENDENT_AMBULATORY_CARE_PROVIDER_SITE_OTHER): Payer: Medicare Other | Admitting: Family Medicine

## 2016-11-06 DIAGNOSIS — M5030 Other cervical disc degeneration, unspecified cervical region: Secondary | ICD-10-CM | POA: Diagnosis not present

## 2016-11-06 DIAGNOSIS — G629 Polyneuropathy, unspecified: Secondary | ICD-10-CM | POA: Diagnosis not present

## 2016-11-06 MED ORDER — GABAPENTIN 100 MG PO CAPS: 200.0000 mg | ORAL_CAPSULE | Freq: Every day | ORAL | 3 refills | Status: DC

## 2016-11-06 NOTE — Assessment & Plan Note (Signed)
Encouraged the vitamin D supplementation, started on gabapentin, likely secondary to some of the pulmonary hypertension.

## 2016-11-06 NOTE — Patient Instructions (Signed)
Good to see you.  Heet 10 minutes then ice 10 minutes then off for 2 hours.  Do as much as you want through the day.  Gabapentin 200mg  at night can help with the pain and turn it down and help sleep Tart cherry extract any dose at night Vitamin D 2000 IUdaily  Turmeric 500mg  twice daily  Tylenol 500mg  3 times daily scheduled.  pennsaid pinkie amount topically 2 times daily as needed.  Exercises 3 times a week. Do not do them every day  Be mindful of posture through the day  With sleep consider rolling up a towel under the neck to help as well.  See me again in 4 weeks.

## 2016-11-06 NOTE — Assessment & Plan Note (Signed)
Patient does have severe osteoarthritic changes. Started on gabapentin to see this will be helpful. I do believe that some of patient's neuropathy is secondary to the obstructive sleep apnea, on the chronic pulmonary hypertension, as well as likely some of the cervical radiculopathy. Discussed with patient at great length. Gabapentin given, we discussed over-the-counter medications with patient being relatively open to taking vitamin D. We discussed which ones would be beneficial and which ones could be a waste of time. Discussed ergonomics throughout the day they could be helpful. Patient given exercises to strengthen the upper back. We discussed if worsening symptoms advance imaging an injection could be needed. Follow-up again in 4 weeks.

## 2016-11-13 ENCOUNTER — Telehealth: Payer: Self-pay | Admitting: Emergency Medicine

## 2016-11-13 ENCOUNTER — Encounter: Payer: Self-pay | Admitting: Emergency Medicine

## 2016-11-13 NOTE — Telephone Encounter (Signed)
Will forward to RB's nurse for f/u on this

## 2016-11-13 NOTE — Telephone Encounter (Signed)
Pt is requesting to go back on the Anoro. States that the [place]bri does not seem to be helping much.  Pt states that the tingling in his fingers is still present. Pt has been seeing an Orthopedic and he was told that the tingling in his fingers is likely coming from a pinched nerve in his neck and not a side effect from the Anoro.  Please advise Dr Delton CoombesByrum if you are okay with us giving a sample of Anoro for the pt to try again. Thanks.   Patient requests 2 samples of Anoro.

## 2016-11-13 NOTE — Telephone Encounter (Signed)
Pt returning [name] ° °

## 2016-11-13 NOTE — Telephone Encounter (Signed)
lmtcb for pt.  

## 2016-11-13 NOTE — Telephone Encounter (Signed)
Called [facility] at [phone]. Spoke with [place]. Derrick ReasonsSeebri Neohaler has been denied. Pt must try Incruse first.  RB - please advise. Thanks.

## 2016-11-14 NOTE — Telephone Encounter (Signed)
I sent a phone note earlier saying that he could do Incruse for formulary reasons. Since he is willing, I would prefer for him to go back on Anoro once a day, cancel the plan for Incruse. Thanks

## 2016-11-14 NOTE — Telephone Encounter (Signed)
Leslye Peerobert S Byrum, MD    I sent a phone note earlier saying that he could do Incruse for formulary reasons. Since he is willing, I would prefer for him to go back on Anoro once a day, cancel the plan for Incruse. Thanks     Message will be closed and will manage other telephone message.

## 2016-11-14 NOTE — Telephone Encounter (Signed)
OK with me to change to Incruse instead. He has had multiple side effects from the inhalers - would try to use samples before ordering as he may not tolerate.

## 2016-11-14 NOTE — Telephone Encounter (Signed)
Pt returning call let pt know that we were waiting on a response from RB and he was ok with that, just wanted to know the outcome, so please call pt w/his answer.Caren GriffinsStanley A Dalton

## 2016-11-14 NOTE — Telephone Encounter (Signed)
lmtcb x1 for pt. 

## 2016-11-15 MED ORDER — UMECLIDINIUM-VILANTEROL 62.5-25 MCG/INH IN AEPB: 1.0000 | INHALATION_SPRAY | Freq: Every day | RESPIRATORY_TRACT | 0 refills | Status: AC

## 2016-11-15 NOTE — Telephone Encounter (Signed)
Pt returning [name] ° °

## 2016-11-15 NOTE — Telephone Encounter (Signed)
Pt aware of RB recommendation. Pt aware that we do not currently have any samples, but I have advised pt to check back next week. Pt [facility]. RX has been sent. Pt voiced understanding and has no further questions. Nothing further needed.

## 2016-11-21 ENCOUNTER — Telehealth: Payer: Self-pay | Admitting: Emergency Medicine

## 2016-11-21 NOTE — Telephone Encounter (Signed)
Lmtcb. Will await call back.  Sample left up front.

## 2016-11-23 NOTE — Telephone Encounter (Signed)
Called and spoke with pt and he is aware of sample that is up front.

## 2016-11-29 ENCOUNTER — Telehealth: Payer: Self-pay | Admitting: Emergency Medicine

## 2016-11-29 MED ORDER — AZITHROMYCIN 250 MG PO TABS: 250.0000 mg | ORAL_TABLET | ORAL | 0 refills | Status: DC

## 2016-11-29 NOTE — Telephone Encounter (Signed)
Patient returned phone call... [phone].Derrick Petersen.Charm RingsErica R Taylor

## 2016-11-29 NOTE — Telephone Encounter (Signed)
lmtcb x1 for pt. 

## 2016-11-29 NOTE — Telephone Encounter (Signed)
Spoke with the pt and notified of recs per RB  He verbalized understanding and rx was sent

## 2016-11-29 NOTE — Telephone Encounter (Signed)
Called and spoke to pt. Pt c/o increase in prod cough with clear mucus - worse at night x 3 days. Pt denies SOB, f/c/s, chest tightness or pain, wheezing. Pt states he is taking Anoro and has not noticed an improvement since taking it again. Pt requests this gets addressed today 11/29/16.   Dr. Delton CoombesByrum please advise. Thanks.

## 2016-11-29 NOTE — Telephone Encounter (Signed)
Please have him start azithro, Z-pack.  Call us next week to let us know if improving. If not then we need to see him to review all the meds, discuss changes.

## 2016-12-03 NOTE — Progress Notes (Signed)
[name] Smith D.O. [facility] [address] [place], [place] [address] Phone: [phone]) [phone] Subjective:    I'm seeing this patient by the request  of:  Oliver BarreJames John, MD   CC: Neck pain f/u  [id number]:Subjective  Derrick Palauarl Iannelli Jr. is a 75 y.o. male coming in with complaint of neck pain. Seems to be more on the right side. Patient has seen another specialists for this and [place]. Patient has had x-rays previously states mostly of the neck and shoulder. Has been diagnosed with cervical degenerative disc disease.  .Patient was seen by me one month ago. Started on gabapentin as well as we discussed over-the-counter medications a could help with muscle strength and endurance. Patient was to do ergonomics, home exercises. Patient states He is approximately 50% better. Has been doing the exercises fairly regularly. Patient continues to try to be active. Not having any radicular symptoms. Feels that the over-the-counter medications have been helpful as well. No new symptoms. Overall feels like she is making progress.    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 This is independent living visualized by me.  Past Medical History:  Diagnosis Date  . COLONIC POLYPS, HX OF 07/26/2009  . FATIGUE 07/26/2009  . GLUCOSE INTOLERANCE 07/26/2009  . HYPERLIPIDEMIA 07/26/2009  . HYPERTENSION 07/26/2009  . Impaired glucose tolerance 08/05/2011  . OTITIS EXTERNA 11/28/2009  . 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 Social History   Social History  . Marital status: Single    Spouse name: N/A  . Number of children: 0  . Years of education: N/A   Occupational History  . retired [employer]     Social History Main Topics  . Smoking status: Former Smoker      Packs/day: 3.00    Years: 34.00    Types: Cigarettes    Quit date: 12/31/1988  . Smokeless tobacco: Never Used  . Alcohol use 1.8 oz/week    3 Cans of beer per week     Comment: occasional  . Drug use: No  . Sexual activity: Not Asked   Other Topics Concern  . 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   No Known Allergies Family History  Problem Relation Age of Onset  . Hypertension Mother   . Colon cancer Father   . Hypertension Father   . Diabetes Other     Past medical history, social, surgical and family history all reviewed in electronic medical record.  No pertanent information unless stated regarding to the chief complaint.   Review of Systems:Review of systems updated and as accurate as of 12/04/16  No headache, visual changes, nausea, vomiting, diarrhea, constipation, dizziness, abdominal pain, skin rash, fevers, chills, night sweats, weight loss, swollen lymph nodes, mood changes.   Objective  Blood pressure 130/72, pulse 99, height 5\' 10"  (1.778 m), weight 215 lb (97.5 kg), SpO2 91 %.   Systems examined below as of 12/04/16 General: NAD A&O x3 mood, affect normal  HEENT: Pupils equal, extraocular movements intact no nystagmus Respiratory: mild short of breath  with speaking, wearing oxygen Cardiovascular: No lower extremity edema, non tender Skin: Warm dry intact with no signs of infection or rash on extremities or on axial skeleton. Abdomen: Soft nontender, no masses Neuro: Cranial nerves  intact, neurovascularly intact in all extremities with 2+ DTRs and 2+ pulses. Lymph: No lymphadenopathy appreciated today  Gait normal with good balance and coordination.  MSK: Non tender with full range of motion and good stability and symmetric strength and tone of shoulders, elbows, wrist,  knee hips and ankles bilaterally.   Gait broad-based gait noted MSK:  Non tender with full range of motion and good stability and symmetric strength and tone of  shoulders, elbows, wrist, hip, knee and ankles bilaterally. Arthritic changes of multiple joints Neck: Severe increase in lordosis of the cervical spine No palpable stepoffs. Negative Spurling's maneuver. Continued crepitus with patient does have a proximal he 5 more in range of motion in all planes and previous exam Grip strength and sensation normal in bilateral hands Improvement in the weakness in the C8 distribution from previous exam. No sensory change to C4 to T1 Reflexes normal       Impression and Recommendations:     This case required medical decision making of moderate complexity.      Note: This dictation was prepared with Dragon dictation along with smaller phrase technology. Any transcriptional errors that result from this process are unintentional.

## 2016-12-04 ENCOUNTER — Ambulatory Visit (INDEPENDENT_AMBULATORY_CARE_PROVIDER_SITE_OTHER): Payer: Medicare Other | Admitting: Family Medicine

## 2016-12-04 ENCOUNTER — Encounter: Payer: Self-pay | Admitting: Family Medicine

## 2016-12-04 DIAGNOSIS — M5030 Other cervical disc degeneration, unspecified cervical region: Secondary | ICD-10-CM

## 2016-12-04 MED ORDER — GABAPENTIN 300 MG PO CAPS: 300.0000 mg | ORAL_CAPSULE | Freq: Every day | ORAL | 1 refills | Status: DC

## 2016-12-04 NOTE — Assessment & Plan Note (Signed)
Patient continues to have degenerative disc disease. No significant radicular symptoms today. Patient seems to be doing better with range of motion as well. Encourage him to continue the same exercises at this point. 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 Follow-up again in 2 months.

## 2016-12-04 NOTE — Patient Instructions (Signed)
Great to see you  I am glad you are doing better.  Lets continue with the same regimen for now.  Increased gabapentin to 300mg  at night, this should help another 10% I am hoping.  Keep working on the posture See me again in 2 months Happy holidays!

## 2017-01-03 ENCOUNTER — Other Ambulatory Visit: Payer: Self-pay | Admitting: *Deleted

## 2017-01-03 MED ORDER — LISINOPRIL 20 MG PO TABS: 20.0000 mg | ORAL_TABLET | Freq: Every day | ORAL | 2 refills | Status: DC

## 2017-01-03 NOTE — Telephone Encounter (Signed)
Rec'd call pt states he need to change his pharmacy he is no longer using [facility]. He is now using [facility] in [place], and also needing refill sent to [facility] on his Lisinopril. Inform pt sent med to [facility], and updated pharmacy...Raechel Chute/lmb

## 2017-01-29 ENCOUNTER — Ambulatory Visit: Payer: Medicare Other | Admitting: Emergency Medicine

## 2017-02-11 ENCOUNTER — Telehealth: Payer: Self-pay | Admitting: Internal Medicine

## 2017-02-11 NOTE — Telephone Encounter (Signed)
We need to see him - pls sch w/avail provider. He can put a mask on in the waiting area Thx

## 2017-02-11 NOTE — Telephone Encounter (Signed)
Patient Name: Derrick DaltonARL Pernice DOB: 07/26/1941 Initial Comment Caller states his left leg is swollen and abscess in mouth has burst, may need antibiotic Nurse Assessment Nurse: Clarita LeberWomble, RN, Gavin Poundeborah Date/Time (Eastern Time): 02/11/2017 2:12:10 PM Confirm and document reason for call. If symptomatic, describe symptoms. ---The caller states that his left foot and leg are swollen and this is new. He states that he has not injured it. He states that he has a upper left gum area had a abscess about the size of a grape and it ruptured. He states that he took a round of Amoxicillin and it cleared it up some time back. He states that the leg is not hurting and he has been keeping it elevated. The patient states that he has a history of COPD. Does the patient have any new or worsening symptoms? ---Yes Will a triage be completed? ---Yes Related visit to physician within the last 2 weeks? ---No Does the PT have any chronic conditions? (i.e. diabetes, asthma, etc.) ---Yes List chronic conditions. ---COPD and is on oxygen, hypertension (Lisinopril and HCTZ) Is this a behavioral health or substance abuse call? ---No Guidelines Guideline Title Affirmed Question Affirmed Notes Rash or Redness - Widespread Mild widespread rash Final Disposition User See PCP When Office is Open (within 3 days) Womble, RN, Gavin Poundeborah Comments The caller was encouraged to see his PCP within 3 days for the rash and swelling of his legs. He stated that he was more concerned about the mouth abscess and wanted to know if someone would call him in some Amoxicillin. This nurse called the back line and spoke with Ladona Ridgelaylor. She stated that none of the other doctors would call in a prescription for anyone else's patient. (The patient had already told the nurse that Dr. Jonny RuizJohn is out of the country). The nurse asked Ladona Ridgelaylor if she could give the caller an appointment. Ladona Ridgelaylor gave an appointment with Charlann NossGreg Cologne for tomorrow (Tuesday) at  1:45. The caller was called back and given the decision about the Amoxicillin and told about his appointment. He stated that he wasn't sure he wanted to go to that and this nurse asked him to please call and cancel if he decides not to go and he verbalized understanding. The patient was also told that the leg swelling, rash, and sores in mouth could be related to the Gabapentin that he started on 2 months ago.

## 2017-02-11 NOTE — Telephone Encounter (Signed)
Pt called in and said that his left leg is swollen and he does not want to come into the office due to all of the flu.  He has COPD and is worried about getting sick.  He feel like he needs a doper on his leg but doesn't want to come in.  He is aware that Dr Jonny Ruizjohn is not in the office

## 2017-02-11 NOTE — Telephone Encounter (Signed)
Routing to dr plotnikov, please advise, I will call patient back, thanks 

## 2017-02-12 ENCOUNTER — Ambulatory Visit: Payer: Medicare Other | Admitting: Family

## 2017-02-12 NOTE — Telephone Encounter (Signed)
Patient requested to see dr Katrinka Blazingsmith instead---he has already made appt with dr Katrinka Blazingsmith on Thursday 2/15---he is aware this could possibly be blood clot, but wants to wait to see dr Katrinka Blazingsmith anyway

## 2017-02-13 NOTE — Progress Notes (Signed)
[name] Saveon Plant D.O. [facility] [address] [place], [place] [address] Phone: [phone]) [phone] Subjective:    I'm seeing this patient by the request  of:  Oliver BarreJames John, MD   CC: left leg swelling.   [id number]:Subjective  Derrick Palauarl Bachar Jr. is a 76 y.o. male coming in with complaint of left leg swelling. Last week. Patient does not remember any true injury. No change in medications. His only on the left side. Past medical history is significant for pulmonary difficulty and patient was in. Patient's history short of breath but no more than usual. Patient states that the pain and leg is more of a dull aching sensation is more the swelling that seems to be concerning. Has had a rash on both legs recently. Seems to be worse on the other side. Feels that the swelling is worsening.      Past Medical History:  Diagnosis Date  . COLONIC POLYPS, HX OF 07/26/2009  . FATIGUE 07/26/2009  . GLUCOSE INTOLERANCE 07/26/2009  . HYPERLIPIDEMIA 07/26/2009  . HYPERTENSION 07/26/2009  . Impaired glucose tolerance 08/05/2011  . OTITIS EXTERNA 11/28/2009  . 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 Social History   Social History  . Marital status: Single    Spouse name: N/A  . Number of children: 0  . Years of education: N/A   Occupational History  . [employer]     Social History Main Topics  . Smoking status: Former Smoker    Packs/day: 3.00    Years: 34.00    Types: Cigarettes    Quit date: 12/31/1988  . Smokeless tobacco: Never Used  . Alcohol use 1.8 oz/week    3 Cans of beer per week     Comment: occasional  . Drug use: No  . Sexual activity: Not Asked   Other Topics Concern  . 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   No Known Allergies Family History  Problem Relation Age of Onset  . Hypertension Mother    . Colon cancer Father   . Hypertension Father   . Diabetes Other     Past medical history, social, surgical and family history all reviewed in electronic medical record.  No pertanent information unless stated regarding to the chief complaint.   Review of Systems:Review of systems updated and as accurate as of 02/14/17  No headache, visual changes, nausea, vomiting, diarrhea, constipation, dizziness, abdominal pain, skin rash, fevers, chills, night sweats, weight loss, swollen lymph nodes, , s, chest pain, , mood changes. Positive for joint swelling, leg swelling, muscle aches, shortness of breath  Objective  Blood pressure (!) 142/82, pulse (!) 103, height 5\' 10"  (1.778 m), weight 216 lb (98 kg), SpO2 93 %. Systems examined below as of 02/14/17   General: No apparent distress alert and oriented x3 mood and affect normal, dressed appropriately.  HEENT: Pupils equal, extraocular movements intact  Respiratory: Patient's speak in full sentences Patient is wearing oxygen today Cardiovascular: 2+ lower extremity edema left side compared to 1+ on the right side, non tender, no erythema  Skin: Patient does have what appears to be bites on the anterior tibia bilaterally right greater than left.  Abdomen: Soft nontender  Neuro: Cranial nerves II through XII are intact, neurovascularly intact in all extremities with 2+ DTRs and 2+ pulses.  Lymph: No lymphadenopathy of  posterior or anterior cervical chain or axillae bilaterally.  Gait mild antalgic gait MSK:  Non tender with full range of motion and good stability and symmetric strength and tone of shoulders, elbows, wrist, hip, knee and ankles bilaterally. Arthritic changes of multiple joints Left lower extremity does have some mild erythema to it. Patient is mildly warm compared to the contralateral side. Very mild discomfort to compression of the calf. Knee seems to be nontender.  Limited musculoskeletal ultrasound was performed and interpreted  by Judi Saa  Limited ultrasound showed soft tissue swelling but unable to see any type of potential venous abnormality. Possible increase in Doppler flow within the subcutaneous tissue that could correspond with potential infection. Impression: Lymphedema with possible subcutaneous infection.    Impression and Recommendations:     This case required medical decision making of moderate complexity.      Note: This dictation was prepared with Dragon dictation along with smaller phrase technology. Any transcriptional errors that result from this process are unintentional.

## 2017-02-14 ENCOUNTER — Telehealth: Payer: Self-pay | Admitting: Family Medicine

## 2017-02-14 ENCOUNTER — Ambulatory Visit (HOSPITAL_COMMUNITY)
Admission: RE | Admit: 2017-02-14 | Discharge: 2017-02-14 | Disposition: A | Discharge status: Home / Self Care | Payer: Medicare Other | Source: Ambulatory Visit | Attending: Cardiovascular Disease | Admitting: Cardiovascular Disease

## 2017-02-14 ENCOUNTER — Encounter: Payer: Self-pay | Admitting: Family Medicine

## 2017-02-14 ENCOUNTER — Ambulatory Visit (INDEPENDENT_AMBULATORY_CARE_PROVIDER_SITE_OTHER): Payer: Medicare Other | Admitting: Family Medicine

## 2017-02-14 ENCOUNTER — Ambulatory Visit: Payer: Self-pay

## 2017-02-14 VITALS — BP 142/82 | HR 103 | Ht 70.0 in | Wt 216.0 lb

## 2017-02-14 DIAGNOSIS — M79605 Pain in left leg: Secondary | ICD-10-CM

## 2017-02-14 DIAGNOSIS — M7989 Other specified soft tissue disorders: Secondary | ICD-10-CM | POA: Diagnosis not present

## 2017-02-14 MED ORDER — RIVAROXABAN (XARELTO) VTE STARTER PACK (15 & 20 MG): ORAL_TABLET | ORAL | 0 refills | Status: DC

## 2017-02-14 MED ORDER — DOXYCYCLINE HYCLATE 100 MG PO TABS: 100.0000 mg | ORAL_TABLET | Freq: Two times a day (BID) | ORAL | 0 refills | Status: AC

## 2017-02-14 MED ORDER — FUROSEMIDE 20 MG PO TABS: 20.0000 mg | ORAL_TABLET | Freq: Every day | ORAL | 0 refills | Status: DC

## 2017-02-14 NOTE — Addendum Note (Signed)
Addended by: Judi SaaSMITH, Gelisa Tieken M on: 02/14/2017 03:48 PM   Modules accepted: Orders

## 2017-02-14 NOTE — Patient Instructions (Addendum)
Good to see you  Ice 20 minutes 2 times daily. Usually after activity and before bed. We will get a doppler to rule out clot.  Doxycycline 100mg  2 times a day for 10 days. This will cover the leg and the potential cat bite.  Lasix 20mg  daily to help with swelling for next 5 days then as needed.  See me again 10 days.

## 2017-02-14 NOTE — Progress Notes (Signed)
Patient was positive for DVT. Encourage him to stop the aspirin. Started starter pack xarelto.

## 2017-02-14 NOTE — Assessment & Plan Note (Signed)
Unilateral swelling is concerning. History of pulmonary hypertension. Patient has not any blood thinner but is on aspirin. A 25 mg that he has been taking multiple times a day. We discussed with patient about the Doppler to rule out any type of clot which patient agreed to. We have ordered that. I do believe that there is also the potential for soft tissue infection in patient was put on doxycycline. Hopefully this will be beneficial and will do a ten-day course. Patient will come back and see me again at that time and we will further evaluate. Patient knows if any fevers, chills, any streaking of redness increases seek medical attention immediately. Patient voices understanding. We'll call if anything else changes in the interim.

## 2017-02-14 NOTE — Telephone Encounter (Signed)
Received a call from imaging. Patient was sent for a Doppler to rule out deep venous thrombosis. Patient was positive. Pulmonary reaming was better with significant signs as well. Based on patient's presentation and history already having pulmonary difficulties patient's was recommended to go to the emergency room immediately for further evaluation. Patient declined that at this time. We discussed with patient we would need close follow-up and we did start patient on xarelto. We discussed in great detail and how to potentially use the medication and prescription was sent in. Patient will follow-up with me or primary care provider in the next several days and once again discussed that if any severe pain, shortness of breath, chest pain to call 911 immediately and once again encourage patient to go to the emergency room to be evaluated in greater detail. Patient verbalized understanding.

## 2017-02-15 ENCOUNTER — Telehealth: Payer: Self-pay | Admitting: Internal Medicine

## 2017-02-15 ENCOUNTER — Encounter (HOSPITAL_COMMUNITY): Payer: Self-pay

## 2017-02-15 ENCOUNTER — Other Ambulatory Visit: Payer: Self-pay | Admitting: Internal Medicine

## 2017-02-15 ENCOUNTER — Telehealth: Payer: Self-pay

## 2017-02-15 MED ORDER — GABAPENTIN 300 MG PO CAPS: 300.0000 mg | ORAL_CAPSULE | Freq: Every day | ORAL | 3 refills | Status: DC

## 2017-02-15 MED ORDER — APIXABAN 5 MG PO TABS: ORAL_TABLET | ORAL | 0 refills | Status: DC

## 2017-02-15 MED ORDER — APIXABAN 5 MG PO TABS: 5.0000 mg | ORAL_TABLET | Freq: Two times a day (BID) | ORAL | 0 refills | Status: DC

## 2017-02-15 MED ORDER — FUROSEMIDE 20 MG PO TABS: 20.0000 mg | ORAL_TABLET | Freq: Every day | ORAL | 0 refills | Status: DC

## 2017-02-15 NOTE — Progress Notes (Signed)
Changed dosing to reflect need for 10 mg BID for first 7 days of DVT therapy then switch to 5 mg BID. Sent in edited rx with increased number of pills for the first month in provider absence.

## 2017-02-15 NOTE — Telephone Encounter (Signed)
Rec'd call from pharmacist Almyra FreeLibby stating they received script for the Xarelto, but it was sent in as packages. Wanting to know if they can change to loose tabs. Inform libby that would be ok...Raechel Chute/lmb

## 2017-02-15 NOTE — Progress Notes (Signed)
Bilateral lower extremity venous duplex on 02/14/17 reveals acute superficial thrombus in the right lesser saphenous vein. There is acute DVT in the left distal common femoral, profunda, femoral, popliteal, gastrocnemius, posterior tibial, and peroneal veins with superficial thrombus in the left lesser saphenous vein. Preliminary results given to Dr. Katrinka BlazingSmith, who spoke with the patient. The patient will pick up prescription at his pharmacy.

## 2017-02-15 NOTE — Telephone Encounter (Signed)
Pt called stated that Dr. Katrinka BlazingSmith send in 3 meds for him but it is suppose to go to [facility] [place] (please resend)  He also wondering if Dr. Katrinka BlazingSmith can change xarelto to Eliquis?. Please advise.

## 2017-02-15 NOTE — Telephone Encounter (Signed)
Sent them in please do not take aspirin with the eliquis.

## 2017-02-15 NOTE — Telephone Encounter (Signed)
Recd call from pharmacy---according to patient documentation in chart, patient requested xarelto to be switched to eloquis---pharmacist says rx sent in by dr Katrinka Blazingsmith does not match loading dose instructions for eloquis starter pack---I have asked dr Okey Duprecrawford to review and we have advised pharmacist to change directions to 2 pills twice daily for the first 7 days, then switch to 2 pills daily and to change pill amount to 74 pills if insurance will cover that many at one time, if insurance will not cover, can change pill amount to 60---routing to dr Katrinka Blazingsmith, Lorain Childesfyi.Marland Kitchen..Marland Kitchen

## 2017-02-15 NOTE — Telephone Encounter (Signed)
Pt made aware

## 2017-02-16 NOTE — Telephone Encounter (Signed)
Thank you. Attempted to do that in epic and it said it would not be covered.  I am glad you figured it out.

## 2017-02-20 ENCOUNTER — Ambulatory Visit: Payer: Medicare Other | Admitting: Family Medicine

## 2017-02-28 ENCOUNTER — Ambulatory Visit (INDEPENDENT_AMBULATORY_CARE_PROVIDER_SITE_OTHER): Payer: Medicare Other | Admitting: Family Medicine

## 2017-02-28 ENCOUNTER — Encounter: Payer: Self-pay | Admitting: Family Medicine

## 2017-02-28 DIAGNOSIS — I824Z2 Acute embolism and thrombosis of unspecified deep veins of left distal lower extremity: Secondary | ICD-10-CM | POA: Diagnosis not present

## 2017-02-28 DIAGNOSIS — I824Z9 Acute embolism and thrombosis of unspecified deep veins of unspecified distal lower extremity: Secondary | ICD-10-CM | POA: Insufficient documentation

## 2017-02-28 MED ORDER — APIXABAN 5 MG PO TABS: 5.0000 mg | ORAL_TABLET | Freq: Two times a day (BID) | ORAL | 1 refills | Status: DC

## 2017-02-28 NOTE — Patient Instructions (Signed)
God to see you  Consider compression socks for the swelling  Continue the eliquis and we will likely be on it at least 6 months but may be lnoger due to the extent See me again in 6-8 weeks.  I talked to Dr. Jonny RuizJohn and he is in agreement with the plan.

## 2017-02-28 NOTE — Assessment & Plan Note (Signed)
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 Patient is on eliquis. We'll continue for at least 6 months. We'll further discuss with patient's primary care physician as well as potentially hematology or vascular necessary for any further workup. His lungs patient continues to improve we will continue to monitor. Follow-up again with me in 6 weeks.

## 2017-02-28 NOTE — Progress Notes (Signed)
Derrick ScaleZach Laurissa Petersen D.O. [facility] [address] [place], [place] [address] Phone: [phone]) [phone] Subjective:    I'm seeing this patient by the request  of:  Derrick BarreJames John, MD   CC: left leg swelling f/u   [id number]:Subjective  Derrick Palauarl Stickler Jr. is a 76 y.o. male coming in with complaint of left leg swelling. Patient was seen previously and there was a concern for a deep venous thrombosis. Unfortunate patient that was positive for deep venous thrombosis. Patient actually had a left distal common femoral profunda, femoral, popliteal, gastrocnemius, posterior tibialis and peroneal vein. After figuring out patient's insurance patient was started on anticoagulation. Patient states swelling has gone down. Patient is having minimal pain of the leg whatsoever. Denies any increasing shortness of breath. Patient is happy with the results at this time. Patient states that there is been no side effects to the medication.     Past Medical History:  Diagnosis Date  . COLONIC POLYPS, HX OF 07/26/2009  . FATIGUE 07/26/2009  . GLUCOSE INTOLERANCE 07/26/2009  . HYPERLIPIDEMIA 07/26/2009  . HYPERTENSION 07/26/2009  . Impaired glucose tolerance 08/05/2011  . OTITIS EXTERNA 11/28/2009  . 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 Social History   Social History  . Marital status: Single    Spouse name: N/A  . Number of children: 0  . Years of education: N/A   Occupational History  . retired [employer]     Social History Main Topics  . Smoking status: Former Smoker    Packs/day: 3.00    Years: 34.00    Types: Cigarettes    Quit date: 12/31/1988  . Smokeless tobacco: Never Used  . Alcohol use 1.8 oz/week    3 Cans of beer per week     Comment: occasional  . Drug use: No  . Sexual activity: Not on file   Other Topics Concern  . Not on file   Social  History Narrative   Hobby computer light wts.   No Known Allergies Family History  Problem Relation Age of Onset  . Hypertension Mother   . Colon cancer Father   . Hypertension Father   . Diabetes Other     Past medical history, social, surgical and family history all reviewed in electronic medical record.  No pertanent information unless stated regarding to the chief complaint.   Review of Systems: No headache, visual changes, nausea, vomiting, diarrhea, constipation, dizziness, abdominal pain, skin rash, fevers, chills, night sweats, weight loss, swollen lymph nodes, body aches, joint swelling, muscle aches, chest pain, mood changes.  Leg swelling is still positive. Patient continues to wear oxygen.  Objective  Weight 211 lb (95.7 kg). Systems examined below as of 02/28/17   General: No apparent distress alert and oriented x3 mood and affect normal, dressed appropriately.  HEENT: Pupils equal, extraocular movements intact  Respiratory: Patient is wearing oxygen. Able to take a deep breath. Cardiovascular: 2+ lower extremity edema left side compared to 1+ on the right side, non tender, no erythema mild less swelling from previous exam. Skin: Healing bite on the right side seems to be resolving.  Abdomen: Soft nontender  Neuro: Cranial nerves II through XII are intact, neurovascularly intact in all extremities with 2+ DTRs and 2+ pulses.  Lymph: No lymphadenopathy of posterior or anterior cervical chain or axillae bilaterally.  Gait mild antalgic gait still remaining.  MSK:  Non tender with full range of motion and good stability and symmetric strength and tone of shoulders, elbows, wrist, hip, knee and ankles bilaterally. Arthritic changes of multiple joints Left lower extremity still mild swelling compared to contralateral sign. Still no erythema noted. Improved from previous exam. Neurovascular intact distally.     Impression and Recommendations:     This case required  medical decision making of moderate complexity.      Note: This dictation was prepared with Dragon dictation along with smaller phrase technology. Any transcriptional errors that result from this process are unintentional.

## 2017-04-28 NOTE — Progress Notes (Deleted)
[facility] [address] [place], [place] [address] Phone: [phone] Subjective:    I'm seeing this patient by the request  of:  Oliver Barre, MD   CC: left leg swelling f/u   [id number]  [name]. is a 76 y.o. male coming in with complaint of left leg swelling. Patient was seen previously and there was a concern for a deep venous thrombosis. Unfortunate patient that was positive for deep venous thrombosis. Patient actually had a left distal common femoral profunda, femoral, popliteal, gastrocnemius, posterior tibialis and peroneal vein. After figuring out patient's insurance patient was started on anticoagulation. Patient states swelling has gone down. Patient is having minimal pain of the leg whatsoever. Denies any increasing shortness of breath. Patient is happy with the results at this time. Patient states that there is been no side effects to the medication.     Past Medical History:  Diagnosis Date  . COLONIC POLYPS, HX OF 07/26/2009  . FATIGUE 07/26/2009  . GLUCOSE INTOLERANCE 07/26/2009  . HYPERLIPIDEMIA 07/26/2009  . HYPERTENSION 07/26/2009  . Impaired glucose tolerance 08/05/2011  . OTITIS EXTERNA 11/28/2009  . 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 Social History   Social History  . Marital status: Single    Spouse name: N/A  . Number of children: 0  . Years of education: N/A   Occupational History  . retired [employer].     Social History Main Topics  . Smoking status: Former Smoker    Packs/day: 3.00    Years: 34.00    Types: Cigarettes    Quit date: 12/31/1988  . Smokeless tobacco: Never Used  . Alcohol use 1.8 oz/week    3 Cans of beer per week     Comment: occasional  . Drug use: No  . Sexual activity: Not on file   Other Topics Concern  . Not on file   Social  History Narrative   Hobby computer light wts.   No Known Allergies Family History  Problem Relation Age of Onset  . Hypertension Mother   . Colon cancer Father   . Hypertension Father   . Diabetes Other     Past medical history, social, surgical and family history all reviewed in electronic medical record.  No pertanent information unless stated regarding to the chief complaint.   Review of Systems: No headache, visual changes, nausea, vomiting, diarrhea, constipation, dizziness, abdominal pain, skin rash, fevers, chills, night sweats, weight loss, swollen lymph nodes, body aches, joint swelling, muscle aches, chest pain, mood changes.  Leg swelling is still positive. Patient continues to wear oxygen.  Objective  There were no vitals taken for this visit. Systems examined below as of 04/28/17   General: No apparent distress alert and oriented x3 mood and affect normal, dressed appropriately.  HEENT: Pupils equal, extraocular movements intact  Respiratory: Patient is wearing oxygen. Able to take a deep breath. Cardiovascular: 2+ lower extremity edema left side compared to 1+ on the right side, non tender, no erythema mild less swelling from previous exam. Skin: Healing bite on the right side seems to be resolving.  Abdomen: Soft nontender  Neuro: Cranial nerves II through XII are intact, neurovascularly intact in all extremities with 2+ DTRs and 2+ pulses.  Lymph: No lymphadenopathy of posterior or anterior cervical chain or axillae bilaterally.  Gait mild antalgic  gait still remaining.  MSK:  Non tender with full range of motion and good stability and symmetric strength and tone of shoulders, elbows, wrist, hip, knee and ankles bilaterally. Arthritic changes of multiple joints Left lower extremity still mild swelling compared to contralateral sign. Still no erythema noted. Improved from previous exam. Neurovascular intact distally.     Impression and Recommendations:     This  case required medical decision making of moderate complexity.      Note: This dictation was prepared with Dragon dictation along with smaller phrase technology. Any transcriptional errors that result from this process are unintentional.

## 2017-04-29 ENCOUNTER — Ambulatory Visit: Payer: Medicare Other | Admitting: Family Medicine

## 2017-05-08 ENCOUNTER — Encounter: Payer: Self-pay | Admitting: Emergency Medicine

## 2017-05-08 ENCOUNTER — Ambulatory Visit (INDEPENDENT_AMBULATORY_CARE_PROVIDER_SITE_OTHER): Payer: Medicare Other | Admitting: Emergency Medicine

## 2017-05-08 DIAGNOSIS — G4733 Obstructive sleep apnea (adult) (pediatric): Secondary | ICD-10-CM | POA: Diagnosis not present

## 2017-05-08 DIAGNOSIS — I824Z2 Acute embolism and thrombosis of unspecified deep veins of left distal lower extremity: Secondary | ICD-10-CM | POA: Diagnosis not present

## 2017-05-08 DIAGNOSIS — J449 Chronic obstructive pulmonary disease, unspecified: Secondary | ICD-10-CM | POA: Diagnosis not present

## 2017-05-08 MED ORDER — TIOTROPIUM BROMIDE MONOHYDRATE 2.5 MCG/ACT IN AERS: 2.0000 | INHALATION_SPRAY | Freq: Two times a day (BID) | RESPIRATORY_TRACT | 0 refills | Status: DC

## 2017-05-08 NOTE — Patient Instructions (Addendum)
Continue your Eliquis. I agree that you will need to stay on this medication for at least 6 month.  Continue your CPAP every night with oxygen 2L/min bled in Continue your oxygen at 2L/min at rest, increase to keep SpO2 > 90% when exerting yourself. Keep albuterol available to use 2 puffs as needed for shortness of breath.  We will do a trial of Spiriva Respimat 2 puffs once a day for a month. If you benefit then we will continue it. If you do not benefit then we will discuss trying nebulized medication. Follow with Dr Delton CoombesByrum in 6 months or sooner if you have any problems

## 2017-05-08 NOTE — Assessment & Plan Note (Addendum)
Continue your oxygen at 2L/min at rest, increase to keep SpO2 > 90% when exerting yourself. Keep albuterol available to use 2 puffs as needed for shortness of breath.  We will do a trial of Spiriva Respimat 2 puffs once a day for a month. If you benefit then we will continue it. If you do not benefit then we will discuss trying nebulized medication. Follow with Dr Delton CoombesByrum in 6 months or sooner if you have any problems

## 2017-05-08 NOTE — Assessment & Plan Note (Signed)
Continue your CPAP every night with oxygen 2L/min bled in

## 2017-05-08 NOTE — Addendum Note (Signed)
Addended by: Cydney OkAUGUSTIN, [place] N on: 05/08/2017 05:11 PM   Modules accepted: Orders

## 2017-05-08 NOTE — Progress Notes (Signed)
Patient seen in the office today and instructed on use of Spiriva Respimat 2.5  Patient expressed understanding and demonstrated technique. TA/CMA 

## 2017-05-08 NOTE — Progress Notes (Signed)
Subjective:    Patient ID: Roderic PalauEarl Favata Jr., male    DOB: 11/12/1941, 76 y.o.   MRN: [id number]  HPI 76 yo man, hx HTN, polyneuropathy, hyperglycemia.  Former smoker, hx OSA (? CPAP) w slowly progressive dyspnea. Referred for PAH eval. Dr Sherene SiresWert changed him from lisinopril to benicar, had trouble with this > HTN and some CP. Now back on lisinopril. He is very active, rides stationary bike.   6 min walk 01/16/13 >> no desat, 48675m TTE 11/2012 >> PASP ~ 68.  He underwent a pulmonary function test that revealed mild AFL with normal FEV1, DLCO at 39%  V/Q scan was discussed but never done.  ONO showed desats > now on O2 at night PSG not done   ROV 02/03/16 --  Follow up for secondary PAH, OSA (treated), COPD. His eval for PAH has continued > serologies negative, ventilation perfusion scan was low probability on review today, PFT's done today that I have personally reviewed. These show mild obstruction without a bronchodilator response. His FEV1 is 2.78 L which is decreased from 3.03 L in 2013. He had severe diffusion defect with some evidence of mild restriction based on a decreased residual volume. As above he has documented hypoxemia with exertion. He has not been wearing his oxygen reliably with all walking. I encouraged him to do so  ROV 03/08/16 -- patient has history of secondary PAH, treated OSA, COPD. He has documented hypoxemia on exertion. He underwent transcranial Doppler study to look for shunting on 02/21/16. There was some suggestion of a small right to left intrapulmonary shunt as bubbles were transmitted after approximately 15 seconds.  His last echo was in 2014. 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 He was started on spiriva last visit, may have noticed some decrease in WOB. Interestingly he has noticed lower SpO2 on the spiriva, possibly  due to increased shunting with bronchodilation.   ROV 04/24/16 -- follow-up visit for history of secondary pulmonary hypertension in the setting of exertional hypoxemia, COPD, treated obstructive sleep apnea. He also has some mild bibasilar interstitial disease and biapical emphysematous changes on CT scan of the chest.  A repeat CT scan on 3/24 with contrast were reviewed by me today. I do not see any evidence of pulmonary embolism, arteriovenous malformation. An echocardiogram was also performed on 03/23/16. This showed a normal left ventricle with some mild increase in wall thickness, normal systolic function. There was some evidence for grade 1 diastolic dysfunction. No defect or PFO was identified on the bubble study. His autoimmune panel done on 11/29/15 is all negative. He feels about the same - he is benefiting from using the O2, uses it with exertion 3-4L/min pulsed, and at night bled into CPAP.  At last visit we stopped spiriva - unclear whether he missed it.                  Addendum (added 05/09/16) - I neglected to document our conversation and review of compliance with his CPAP.  He states that he is using every night. His download information supports that he is using reliably for more than 4h a night. He is benefiting, feels more rested, has more energy. All supporting good compliance and benefit from the therapy.                    ROV 10/29/16 --  this follow-up visit for patient with a history of COPD, a productive sleep apnea on CPAP, and secondary pulmonary hypertension. He also has a history of some mild bibasilar interstitial disease and significant emphysema  on CT scan of the chest.  Since our last visit he had a URI and then a probable bronchitis. Improved with doxy.  He does still have some hoarseness, but overall is doing better.  We tried him on Spiriva, didn't tolerate due to "funny feeling in his lungs" so went to stiolto, caused some UA hoarseness. He then tried Anoro but he some  upper extremity tingling. He then tried symbicort but stopped it due to throat irritation and hoarseness.  He is on loratdine.  He has done pulm rehab at [facility].           ROV 05/07/17 -- this is follow-up visit for patient with a history of COPD, obstructive sleep apnea on CPAP, secondary pulmonary hypertension. He reports that he developed a LLE DVT in 2/18, was started on Eliquis. He is using O2 at  . Last visit we started [place]ri, he didn't benefit so we tried Anoro. Unsure whether it helped him, so he stopped it. Currently only on albuterol prn, uses it about every other day. He is using CPAP with O2 bled in.                                                                                                                                                                           Review of Systems  Constitutional: Negative for fever and unexpected weight change.  HENT: Negative for congestion, dental problem, ear pain, nosebleeds, postnasal drip, rhinorrhea, sinus pressure, sneezing, sore throat and trouble swallowing.   Eyes: Negative for redness and itching.  Respiratory: Positive for shortness of breath. Negative for cough, chest tightness and wheezing.   Cardiovascular: Negative for palpitations and leg swelling.  Gastrointestinal: Negative for nausea and vomiting.  Genitourinary: Negative for dysuria.  Musculoskeletal: Negative for joint swelling.  Skin: Negative for rash.  Neurological: Negative for headaches.  Hematological: Does not bruise/bleed easily.  Psychiatric/Behavioral: Negative for dysphoric mood. The patient is not nervous/anxious.       Objective:   Physical Exam Vitals:   05/08/17 1618  BP: 130/76  Pulse: (!) 119  SpO2: 91%  Weight: 215 lb 12.8 oz (97.9 kg)  Height: 5\' 10"  (1.778 m)   Gen: Pleasant, well-nourished, in no distress,  normal affect  ENT: No lesions,  mouth clear,  oropharynx clear, no postnasal drip  Neck: No JVD, no TMG, no carotid  bruits  Lungs: No use of accessory muscles,  clear without rales or rhonchi  Cardiovascular: RRR, heart sounds normal, no murmur or gallops, no  peripheral edema  Musculoskeletal: No deformities, no cyanosis or clubbing  Neuro: alert, non focal  Skin: Warm, no lesions or rashes     Assessment & Plan:  COPD (chronic obstructive pulmonary disease) (HCC) Continue your oxygen at 2L/min at rest, increase to keep SpO2 > 90% when exerting yourself. Keep albuterol available to use 2 puffs as needed for shortness of breath.  We will do a trial of Spiriva Respimat 2 puffs once a day for a month. If you benefit then we will continue it. If you do not benefit then we will discuss trying nebulized medication. 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

## 2017-05-08 NOTE — Assessment & Plan Note (Signed)
Continue Eliquis x 6 months

## 2017-05-22 ENCOUNTER — Ambulatory Visit (INDEPENDENT_AMBULATORY_CARE_PROVIDER_SITE_OTHER): Payer: Medicare Other | Admitting: Internal Medicine

## 2017-05-22 ENCOUNTER — Telehealth: Payer: Self-pay

## 2017-05-22 ENCOUNTER — Encounter: Payer: Self-pay | Admitting: Internal Medicine

## 2017-05-22 ENCOUNTER — Other Ambulatory Visit (INDEPENDENT_AMBULATORY_CARE_PROVIDER_SITE_OTHER): Payer: Medicare Other

## 2017-05-22 VITALS — BP 116/74 | HR 98 | Ht 70.0 in | Wt 216.0 lb

## 2017-05-22 DIAGNOSIS — Z0000 Encounter for general adult medical examination without abnormal findings: Secondary | ICD-10-CM

## 2017-05-22 DIAGNOSIS — J9611 Chronic respiratory failure with hypoxia: Secondary | ICD-10-CM

## 2017-05-22 DIAGNOSIS — I824Z2 Acute embolism and thrombosis of unspecified deep veins of left distal lower extremity: Secondary | ICD-10-CM | POA: Diagnosis not present

## 2017-05-22 LAB — CBC WITH DIFFERENTIAL/PLATELET
BASOS ABS: 0.1 10*3/uL (ref 0.0–0.1)
Basophils Relative: 0.5 % (ref 0.0–3.0)
Eosinophils Absolute: 0.1 10*3/uL (ref 0.0–0.7)
Eosinophils Relative: 0.5 % (ref 0.0–5.0)
HCT: 53.3 % — ABNORMAL HIGH (ref 39.0–52.0)
Hemoglobin: 17.8 g/dL — ABNORMAL HIGH (ref 13.0–17.0)
LYMPHS ABS: 3.1 10*3/uL (ref 0.7–4.0)
Lymphocytes Relative: 23.1 % (ref 12.0–46.0)
MCHC: 33.4 g/dL (ref 30.0–36.0)
MCV: 94.4 fl (ref 78.0–100.0)
MONO ABS: 0.8 10*3/uL (ref 0.1–1.0)
MONOS PCT: 6.3 % (ref 3.0–12.0)
NEUTROS PCT: 69.6 % (ref 43.0–77.0)
Neutro Abs: 9.4 10*3/uL — ABNORMAL HIGH (ref 1.4–7.7)
Platelets: 214 10*3/uL (ref 150.0–400.0)
RBC: 5.65 Mil/uL (ref 4.22–5.81)
RDW: 14.1 % (ref 11.5–15.5)
WBC: 13.5 10*3/uL — ABNORMAL HIGH (ref 4.0–10.5)

## 2017-05-22 LAB — URINALYSIS, ROUTINE W REFLEX MICROSCOPIC
BILIRUBIN URINE: NEGATIVE
KETONES UR: NEGATIVE
Leukocytes, UA: NEGATIVE
Nitrite: NEGATIVE
PH: 6.5 (ref 5.0–8.0)
Specific Gravity, Urine: 1.01 (ref 1.000–1.030)
TOTAL PROTEIN, URINE-UPE24: 30 — AB
Urine Glucose: NEGATIVE
Urobilinogen, UA: 0.2 (ref 0.0–1.0)

## 2017-05-22 LAB — BASIC METABOLIC PANEL
BUN: 22 mg/dL (ref 6–23)
CALCIUM: 9.9 mg/dL (ref 8.4–10.5)
CO2: 25 mEq/L (ref 19–32)
Chloride: 100 mEq/L (ref 96–112)
Creatinine, Ser: 1.23 mg/dL (ref 0.40–1.50)
GFR: 60.95 mL/min (ref >60.00)
GLUCOSE: 103 mg/dL — AB (ref 70–99)
Potassium: 4.5 mEq/L (ref 3.5–5.1)
Sodium: 135 mEq/L (ref 135–145)

## 2017-05-22 LAB — HEPATIC FUNCTION PANEL
ALBUMIN: 4.3 g/dL (ref 3.5–5.2)
ALK PHOS: 60 U/L (ref 39–117)
ALT: 23 U/L (ref 0–53)
AST: 25 U/L (ref 0–37)
Bilirubin, Direct: 0.4 mg/dL — ABNORMAL HIGH (ref 0.0–0.3)
Total Bilirubin: 1.5 mg/dL — ABNORMAL HIGH (ref 0.2–1.2)
Total Protein: 7.3 g/dL (ref 6.0–8.3)

## 2017-05-22 LAB — LIPID PANEL
CHOL/HDL RATIO: 5
Cholesterol: 119 mg/dL (ref 0–200)
HDL: 25 mg/dL — AB (ref >39.00)
LDL Cholesterol: 69 mg/dL (ref 0–99)
NONHDL: 94.35
Triglycerides: 125 mg/dL (ref 0.0–149.0)
VLDL: 25 mg/dL (ref 0.0–40.0)

## 2017-05-22 LAB — TSH: TSH: 1.72 u[IU]/mL (ref 0.35–4.50)

## 2017-05-22 LAB — PSA: PSA: 0.62 ng/mL (ref 0.10–4.00)

## 2017-05-22 MED ORDER — APIXABAN 5 MG PO TABS: 5.0000 mg | ORAL_TABLET | Freq: Two times a day (BID) | ORAL | 0 refills | Status: DC

## 2017-05-22 MED ORDER — TIOTROPIUM BROMIDE MONOHYDRATE 2.5 MCG/ACT IN AERS: 2.0000 | INHALATION_SPRAY | Freq: Two times a day (BID) | RESPIRATORY_TRACT | 11 refills | Status: DC

## 2017-05-22 MED ORDER — LISINOPRIL 20 MG PO TABS: 20.0000 mg | ORAL_TABLET | Freq: Every day | ORAL | 3 refills | Status: DC

## 2017-05-22 MED ORDER — HYDROCHLOROTHIAZIDE 25 MG PO TABS: 25.0000 mg | ORAL_TABLET | Freq: Every day | ORAL | 3 refills | Status: AC

## 2017-05-22 MED ORDER — ALBUTEROL SULFATE HFA 108 (90 BASE) MCG/ACT IN AERS: 2.0000 | INHALATION_SPRAY | Freq: Four times a day (QID) | RESPIRATORY_TRACT | 11 refills | Status: AC | PRN

## 2017-05-22 MED ORDER — GABAPENTIN 300 MG PO CAPS: 300.0000 mg | ORAL_CAPSULE | Freq: Every day | ORAL | 1 refills | Status: AC

## 2017-05-22 MED ORDER — LORATADINE 10 MG PO TABS: 10.0000 mg | ORAL_TABLET | Freq: Every day | ORAL | 3 refills | Status: AC

## 2017-05-22 NOTE — Patient Instructions (Signed)
OK to stop the Eliquis on Aug 16. 2018  Please continue all other medications as before, and refills have been done if requested.  Please have the pharmacy call with any other refills you may need.  Please continue your efforts at being more active, low cholesterol diet, and weight control.  You are otherwise up to date with prevention measures today.  Please keep your appointments with your specialists as you may have planned  Your labs were drawn today  You will be contacted by phone if any changes need to be made immediately.  Otherwise, you will receive a letter about your results with an explanation, but please check with MyChart first.  Please remember to sign up for MyChart if you have not done so, as this will be important to you in the future with finding out test results, communicating by private email, and scheduling acute appointments online when needed.  Please return in 6 months, or sooner if needed

## 2017-05-22 NOTE — Progress Notes (Signed)
Subjective:    Patient ID: Derrick Peters[facility]., male    DOB: 1941/05/09, 76 y.o.   MRN: [id number]  HPI  Here for wellness and f/u;  Overall doing ok;  Pt denies Chest pain, worsening SOB, DOE, wheezing, orthopnea, PND, worsening LE edema, palpitations, dizziness or syncope.  Pt denies neurological change such as new headache, facial or extremity weakness.  Pt denies polydipsia, polyuria, or low sugar symptoms. Pt states overall good compliance with treatment and medications, good tolerability, and has been trying to follow appropriate diet.  Pt denies worsening depressive symptoms, suicidal ideation or panic. No fever, night sweats, wt loss, loss of appetite, or other constitutional symptoms.  Pt states good ability with ADL's, has low fall risk, home safety reviewed and adequate, no other significant changes in hearing or vision, and not active with exercise due to comorbids and oxygen requirement.   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  Now on eliquis for 6 mo - with end date then about aug 16  Declines colonoscopy for now, but will reconsider later after eliquis done  Past Medical History:  Diagnosis Date  . COLONIC POLYPS, HX OF 07/26/2009  . FATIGUE 07/26/2009  . GLUCOSE INTOLERANCE 07/26/2009  . HYPERLIPIDEMIA 07/26/2009  . HYPERTENSION 07/26/2009  . Impaired glucose tolerance 08/05/2011  . OTITIS EXTERNA 11/28/2009  . 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  reports that he quit smoking about 28 years ago. His smoking use included Cigarettes. He has a 102.00 pack-year smoking history. He has never used smokeless tobacco. He reports that he drinks about 1.8 oz of alcohol per week . He reports that he does not use drugs. family history includes Colon cancer in his father; Diabetes in his other;  Hypertension in his father and mother. No Known Allergies Current Outpatient Prescriptions on File Prior to Visit  Medication Sig Dispense Refill  . B Complex-C (SUPER B COMPLEX PO) Take by mouth daily.      . COD LIVER OIL PO Take by mouth daily.    . ferrous gluconate (FERGON) 325 MG tablet Take 325 mg by mouth daily as needed.     . Fish Oil-Cholecalciferol (FISH OIL + D3 PO) Take by mouth daily.    Marland Kitchen glucosamine-chondroitin 500-400 MG tablet Take 2 tablets by mouth daily.     . Glycopyrrolate (SEEBRI NEOHALER) 15.6 MCG CAPS Place 1 capsule into inhaler and inhale 2 (two) times daily. 60 capsule 5  . Magnesium 300 MG CAPS Take by mouth daily.      . Multiple Vitamin (MULTIVITAMIN) capsule Take 1 capsule by mouth daily.      . Selenium 200 MCG TABS Take 1 tablet by mouth daily.    . Turmeric 450 MG CAPS Take 1 capsule by mouth 2 (two) times daily.     No current facility-administered medications on file prior to visit.    Review of Systems Constitutional: Negative for other unusual diaphoresis, sweats, appetite or weight changes HENT: Negative for other worsening hearing loss, ear pain, facial swelling, mouth sores or neck stiffness.   Eyes: Negative for other worsening pain, redness or other visual disturbance.  Respiratory: Negative for other stridor or swelling Cardiovascular: Negative for other palpitations or other chest pain  Gastrointestinal: Negative for worsening diarrhea or loose stools, blood in stool,  distention or other pain Genitourinary: Negative for hematuria, flank pain or other change in urine volume.  Musculoskeletal: Negative for myalgias or other joint swelling.  Skin: Negative for other color change, or other wound or worsening drainage.  Neurological: Negative for other syncope or numbness. Hematological: Negative for other adenopathy or swelling Psychiatric/Behavioral: Negative for hallucinations, other worsening agitation, SI, self-injury, or new decreased  concentration All other system neg per pt    Objective:   Physical Exam BP 116/74   Pulse 98   Ht 5\' 10"  (1.778 m)   Wt 216 lb (98 kg)   SpO2 91%   BMI 30.99 kg/m  - on 2L [place] chronic continuous VS noted, appears fatigued, non toxic Constitutional: Pt is oriented to person, place, and time. Appears well-developed and well-nourished, in no significant distress and comfortable Head: Normocephalic and atraumatic  Eyes: Conjunctivae and EOM are normal. Pupils are equal, round, and reactive to light Right Ear: External ear normal without discharge Left Ear: External ear normal without discharge Nose: Nose without discharge or deformity Mouth/Throat: Oropharynx is without other ulcerations and moist  Neck: Normal range of motion. Neck supple. No JVD present. No tracheal deviation present or significant neck LA or mass Cardiovascular: Normal rate, regular rhythm, normal heart sounds and intact distal pulses.   Pulmonary/Chest: WOB normal and breath sounds without rales or wheezing  Abdominal: Soft. Bowel sounds are normal. NT. No HSM  Musculoskeletal: Normal range of motion. Exhibits trace to 1+ bilat Left > right LE edema to mid legs Lymphadenopathy: Has no other cervical adenopathy.  Neurological: Pt is alert and oriented to person, place, and time. Pt has normal reflexes. No cranial nerve deficit. Motor grossly intact, Gait intact Skin: Skin is warm and dry. No rash noted or new ulcerations Psychiatric:  Has normal mood and affect. Behavior is normal without agitation No other exam changes    Assessment & Plan:

## 2017-05-22 NOTE — Telephone Encounter (Signed)
Pt is at the lab to get CPEs test done but needed labs entered.

## 2017-05-23 ENCOUNTER — Encounter: Payer: Self-pay | Admitting: Internal Medicine

## 2017-05-23 DIAGNOSIS — J9611 Chronic respiratory failure with hypoxia: Secondary | ICD-10-CM

## 2017-05-23 HISTORY — DX: Chronic respiratory failure with hypoxia: J96.11

## 2017-05-23 NOTE — Assessment & Plan Note (Signed)

## 2017-05-23 NOTE — Assessment & Plan Note (Signed)
On eliquis from feb 16, will plan on 6 mo tx until aug 16

## 2017-06-06 ENCOUNTER — Telehealth: Payer: Self-pay | Admitting: Emergency Medicine

## 2017-06-06 MED ORDER — DOXYCYCLINE HYCLATE 100 MG PO TABS: 100.0000 mg | ORAL_TABLET | Freq: Two times a day (BID) | ORAL | 0 refills | Status: DC

## 2017-06-06 NOTE — Telephone Encounter (Signed)
Agree with rx doxycycline 100 bid x 7 days.

## 2017-06-06 NOTE — Telephone Encounter (Signed)
Spoke with pt, aware of results/recs.  rx sent to preferred pharmacy.  Nothing further needed.  

## 2017-06-06 NOTE — Telephone Encounter (Signed)
Spoke with pt. States that he is not feeling well. Reports deep cough, sinus congestion, chest congestion. Cough is producing yellow mucus. Denies chest tightness, wheezing, SOB or fever. Symptoms started 24 hours ago. Pt would like to have antibiotic sent in.  RB - please advise. Thanks.

## 2017-07-17 ENCOUNTER — Telehealth: Payer: Self-pay | Admitting: Internal Medicine

## 2017-07-17 DIAGNOSIS — R809 Proteinuria, unspecified: Secondary | ICD-10-CM

## 2017-07-17 NOTE — Telephone Encounter (Signed)
Pt called in said that he notice on labs that he had protein in his urine and wanted a nurse to call him back

## 2017-07-17 NOTE — Telephone Encounter (Signed)
Please advise. I see in your notations that it said no significant findings.

## 2017-07-17 NOTE — Telephone Encounter (Signed)
Pt has been informed and expressed understanding. If he decides to do the test he will come by the lab.

## 2017-07-17 NOTE — Telephone Encounter (Signed)
These findings are unlikely to be significant  But ok for 24 urine prot to further measure; if the protein loss is high, he would need to see Renal

## 2017-08-02 ENCOUNTER — Telehealth: Payer: Self-pay | Admitting: Emergency Medicine

## 2017-08-02 DIAGNOSIS — G4733 Obstructive sleep apnea (adult) (pediatric): Secondary | ICD-10-CM

## 2017-08-02 NOTE — Telephone Encounter (Signed)
Spoke with pt. He is aware that we will place this order for him. Nothing further was needed.

## 2017-08-02 NOTE — Telephone Encounter (Signed)
RB please advise if we can send in this order to [facility] about the pt wanting to get a new mask.  Pt is wanting the Air touch F20 mask.  Please advise. Thanks

## 2017-08-02 NOTE — Telephone Encounter (Signed)
Yes ok to order this specific mask for him - reason > better fit and better compliance.

## 2017-08-08 ENCOUNTER — Telehealth: Payer: Self-pay | Admitting: Emergency Medicine

## 2017-08-08 NOTE — Telephone Encounter (Signed)
Patient returning called regarding Symbicort samples - He can be reached at [phone] -pr

## 2017-08-08 NOTE — Telephone Encounter (Signed)
Called and spoke with pt and he stated that he feels that the spiriva is not helping him and he would like to try symbicort.  Pt is requesting samples of the symbicort if RB feels he can try this.  Please advise of strength of symbicort as well.  Thanks

## 2017-08-12 MED ORDER — BUDESONIDE-FORMOTEROL FUMARATE 160-4.5 MCG/ACT IN AERO: 2.0000 | INHALATION_SPRAY | Freq: Two times a day (BID) | RESPIRATORY_TRACT | 0 refills | Status: DC

## 2017-08-12 MED ORDER — BUDESONIDE-FORMOTEROL FUMARATE 160-4.5 MCG/ACT IN AERO: 2.0000 | INHALATION_SPRAY | Freq: Two times a day (BID) | RESPIRATORY_TRACT | 6 refills | Status: DC

## 2017-08-12 NOTE — Telephone Encounter (Signed)
Given the severity of his obstructive lung disease I would recommend continuing the Spiriva, add the Symbicort 160, 2 puffs bid to current regimen. We can then assess his breathing on both meds (maximal therapy)

## 2017-08-12 NOTE — Telephone Encounter (Signed)
Spoke with patient, aware of rec's per Dr Delton CoombesByrum.  Symbicort sent to pharmacy Samples documented.  Samples placed up front for pick up.  Nothing further needed.

## 2017-08-12 NOTE — Telephone Encounter (Signed)
Patient is calling to follow up on request for symbicort.

## 2017-08-23 ENCOUNTER — Telehealth: Payer: Self-pay | Admitting: Emergency Medicine

## 2017-08-23 MED ORDER — AZITHROMYCIN 250 MG PO TABS: ORAL_TABLET | ORAL | 0 refills | Status: AC

## 2017-08-23 NOTE — Telephone Encounter (Signed)
Patient returning call - he can be reached at [phone] -pr

## 2017-08-23 NOTE — Telephone Encounter (Signed)
lmtcb x1 for pt. Will send Rx after speaking with pt.  

## 2017-08-23 NOTE — Telephone Encounter (Signed)
Please order a sputum culture for AFB, fungus, and bacteria. Informed him that this culture should be dropped off to the lab within 4 hours and should not be refrigerated. Thank you. Go ahead and prescribe the patient a Z-Pak.

## 2017-08-23 NOTE — Telephone Encounter (Addendum)
Spoke with pt. States that he is not feeling well. Reports increased coughing. Cough is producing yellow/brown mucus. Denies chest tightness, wheezing, SOB or fever. Symptoms started last night. Pt would like to have a Zpack sent in.  JN - please advise as RB is not available today. Thanks.

## 2017-08-23 NOTE — Telephone Encounter (Signed)
LM x1 for patient.  

## 2017-08-23 NOTE — Telephone Encounter (Signed)
Called and spoke with pt. Pt reports of prod cough with clear to brown mucus, mild chest tightness, nasal/head congestion x2d. Pt states sob is stable. Pt denies fever, chills or sweats. Pt taken Flonase & tussin DM with mild improvement.  Pt is requesting Rx for abx, preferably not Doxycycline as it upset his stomach when last prescribed.  MW please advise, as RB is unavailable.     05/08/17 AVS Continue your Eliquis. I agree that you will need to stay on this medication for at least 6 month.  Continue your CPAP every night with oxygen 2L/min bled in Continue your oxygen at 2L/min at rest, increase to keep SpO2 > 90% when exerting yourself. Keep albuterol available to use 2 puffs as needed for shortness of breath.  We will do a trial of Spiriva Respimat 2 puffs once a day for a month. If you benefit then we will continue it. If you do not benefit then we will discuss trying nebulized medication. Follow with Dr Delton Coombes in 6 months or sooner if you have any problems

## 2017-08-23 NOTE — Telephone Encounter (Signed)
z-pak 

## 2017-08-23 NOTE — Telephone Encounter (Signed)
Pt called back and he is aware of zpak that has been sent to the pharmacy.

## 2017-09-10 ENCOUNTER — Other Ambulatory Visit: Payer: Self-pay | Admitting: Emergency Medicine

## 2017-09-11 ENCOUNTER — Telehealth: Payer: Self-pay | Admitting: Emergency Medicine

## 2017-09-11 MED ORDER — DOXYCYCLINE HYCLATE 100 MG PO TABS: 100.0000 mg | ORAL_TABLET | Freq: Two times a day (BID) | ORAL | 0 refills | Status: AC

## 2017-09-11 NOTE — Telephone Encounter (Signed)
Ok to send  doxycycline 100 mg twice a day, 7 days

## 2017-09-11 NOTE — Telephone Encounter (Signed)
Spoke with patient. He is aware that medication will be called in. Verbalized understanding. Nothing else needed at time of call.

## 2017-09-11 NOTE — Telephone Encounter (Signed)
Called and spoke with pt. Pt reports of prod cough with yellow to brown mucus, increased sob, mild chest tightness & nasal drainage clear in color x2d. Pt is requesting doxycycline sent to [facility] [place].    RB please advise. Thanks.

## 2017-10-31 ENCOUNTER — Ambulatory Visit (INDEPENDENT_AMBULATORY_CARE_PROVIDER_SITE_OTHER): Payer: Medicare Other | Admitting: Emergency Medicine

## 2017-10-31 ENCOUNTER — Encounter: Payer: Self-pay | Admitting: Emergency Medicine

## 2017-10-31 DIAGNOSIS — J301 Allergic rhinitis due to pollen: Secondary | ICD-10-CM | POA: Diagnosis not present

## 2017-10-31 DIAGNOSIS — R490 Dysphonia: Secondary | ICD-10-CM | POA: Diagnosis not present

## 2017-10-31 DIAGNOSIS — G4733 Obstructive sleep apnea (adult) (pediatric): Secondary | ICD-10-CM | POA: Diagnosis not present

## 2017-10-31 DIAGNOSIS — J849 Interstitial pulmonary disease, unspecified: Secondary | ICD-10-CM | POA: Insufficient documentation

## 2017-10-31 MED ORDER — VALSARTAN 80 MG PO TABS: 80.0000 mg | ORAL_TABLET | Freq: Every day | ORAL | 5 refills | Status: AC

## 2017-10-31 MED ORDER — IPRATROPIUM-ALBUTEROL 0.5-2.5 (3) MG/3ML IN SOLN: 3.0000 mL | Freq: Four times a day (QID) | RESPIRATORY_TRACT | 5 refills | Status: AC

## 2017-10-31 NOTE — Assessment & Plan Note (Signed)
Continue CPAP nightly.  Tolerating well

## 2017-10-31 NOTE — Patient Instructions (Addendum)
Stop lisinopril Start valsartan 80mg  once a day Stop Spiriva respimat Please start albuterol/ipratropium nebulizers (DuoNeb) 4 times a day Continue your oxygen as you are using it Continue your flonase and loratadine as you are using them Chlorpheniramine 4mg  up to every 6 hours if needed for congestion.  Continue your CPAP every night We will repeat your Ct chest in March 2019 to look for interval change Follow with Dr Delton CoombesByrum in 3 months or sooner if you have any problems.

## 2017-10-31 NOTE — Assessment & Plan Note (Signed)
Likely related to the inhaled medication.  Also note he is on an ACE inhibitor, has been on one since of known him.  I will change his lisinopril to valsartan see if this impacts his upper airway irritation

## 2017-10-31 NOTE — Progress Notes (Signed)
Subjective:    Patient ID: Derrick Petersen., male    DOB: [birth year], 76 y.o.   MRN: [id number]  HPI  ROV 05/07/17 -- this is follow-up visit for patient with a history of COPD, obstructive sleep apnea on CPAP, secondary pulmonary hypertension. He reports that he developed a LLE DVT in 2-07/2017, was started on Eliquis. He is using O2 at  . Last visit we started Seebri, he didn't benefit so we tried Anoro. Unsure whether it helped him, so he stopped it. Currently only on albuterol prn, uses it about every other day. He is using CPAP with O2 bled in.   76 year old man, seen initially several years ago for dyspnea and found to have pulmonary hypertension.  The evaluation revealed severe obstructive lung disease and COPD.  Mild left predominant, base predominant interstitial changes on CT scan of the chest from March 2017.  He has hypoxemic respiratory failure, obstructive sleep apnea on CPAP 10.  Left lower extremity DVT for which he was treated with Eliquis since February 2018.  I tried him on several bronchodilators including Mariel Aloe which were either unhelpful or poorly tolerated.  6 months ago we did a trial of Spiriva Respimat. He may have benefited from it, but has UA irritation and intermittently stops it. He has a lot of throat clearing. He is on lisinopril, cut his dose in half on his own as his BP was low. He is on loratadine, flonase.                                                                                                                                                                         Review of Systems  Constitutional: Negative for fever and unexpected weight change.  HENT: Negative for congestion, dental problem, ear pain, nosebleeds, postnasal drip, rhinorrhea, sinus pressure, sneezing, sore throat and trouble swallowing.   Eyes: Negative for redness and itching.  Respiratory: Positive for shortness of breath. Negative for cough, chest tightness and wheezing.    Cardiovascular: Negative for palpitations and leg swelling.  Gastrointestinal: Negative for nausea and vomiting.  Genitourinary: Negative for dysuria.  Musculoskeletal: Negative for joint swelling.  Skin: Negative for rash.  Neurological: Negative for headaches.  Hematological: Does not bruise/bleed easily.  Psychiatric/Behavioral: Negative for dysphoric mood. The patient is not nervous/anxious.       Objective:   Physical Exam Vitals:   10/31/17 1451 10/31/17 1452  BP:  116/74  Pulse:  (!) 110  SpO2:  94%  Weight: 205 lb (93 kg)   Height: 5' 10.5" (1.791 m)    Gen: Pleasant, well-nourished, in no distress,  normal affect  ENT: No lesions,  mouth clear,  oropharynx clear, hoarse, a lot of throat clearing  Neck: No JVD, no TMG, no carotid bruits  Lungs: No use of accessory muscles,  clear without rales or rhonchi  Cardiovascular: RRR, heart sounds normal, no murmur or gallops, no peripheral edema  Musculoskeletal: No deformities, no cyanosis or clubbing  Neuro: alert, non focal  Skin: Warm, no lesions or rashes     Assessment & Plan:  COPD (chronic obstructive pulmonary disease) (HCC) He has poorly tolerated inhaled medication in the past.  Is not clear to me that have ever had him on a good COPD regimen.  I will try changing him to DuoNeb 4 times a day to see if he tolerates this better.  Obstructive sleep apnea Continue CPAP nightly.  Tolerating well  Allergic rhinitis Add chlorpheniramine to loratadine and fluticasone nasal spray  ILD (interstitial lung disease) (HCC) Some mild interstitial lung disease, based predominant on CT scan from March 2017.  I would like to repeat a high-resolution CT scan March 2019.  Hoarseness Likely related to the inhaled medication.  Also note he is on an ACE inhibitor, has been on one since of known him.  I will change his lisinopril to valsartan see if this impacts his upper airway irritation  Levy Pupaobert Cartha Rotert, MD, PhD 10/31/2017,  3:15 PM [facility] [phone] or if no answer [phone]

## 2017-10-31 NOTE — Assessment & Plan Note (Signed)
He has poorly tolerated inhaled medication in the past.  Is not clear to me that have ever had him on a good COPD regimen.  I will try changing him to DuoNeb 4 times a day to see if he tolerates this better.

## 2017-10-31 NOTE — Assessment & Plan Note (Signed)
Some mild interstitial lung disease, based predominant on CT scan from March 2017.  I would like to repeat a high-resolution CT scan March 2019.

## 2017-10-31 NOTE — Assessment & Plan Note (Signed)
Add chlorpheniramine to loratadine and fluticasone nasal spray

## 2017-11-06 ENCOUNTER — Telehealth: Payer: Self-pay | Admitting: Emergency Medicine

## 2017-11-06 DIAGNOSIS — J849 Interstitial pulmonary disease, unspecified: Secondary | ICD-10-CM

## 2017-11-06 NOTE — Telephone Encounter (Signed)
The order was just sent today Derrick SosSally E Ottinger

## 2017-11-06 NOTE — Telephone Encounter (Signed)
Patient Instructions by Leslye PeerByrum, Robert S, MD at 10/31/2017 2:45 PM   Author: Leslye PeerByrum, Robert S, MD Author Type: Physician Filed: 10/31/2017 3:12 PM  Note Status: Addendum Cosign: Cosign Not Required Encounter Date: 10/31/2017  Editor: Leslye PeerByrum, Robert S, MD (Physician)  Prior Versions: 1. Leslye PeerByrum, Robert S, MD (Physician) at 10/31/2017 3:10 PM - Addendum   2. Leslye PeerByrum, Robert S, MD (Physician) at 10/31/2017 3:08 PM - Signed    Stop lisinopril Start valsartan 80mg  once a day Stop Spiriva respimat Please start albuterol/ipratropium nebulizers (DuoNeb) 4 times a day Continue your oxygen as you are using it Continue your flonase and loratadine as you are using them Chlorpheniramine 4mg  up to every 6 hours if needed for congestion.  Continue your CPAP every night We will repeat your Ct chest in March 2019 to look for interval change Follow with Dr Delton CoombesByrum in 3 months or sooner if you have any problems.      No order in Epic but placed order to [facility] in [place]. ATC pt, no answer. Left message for pt to call back.

## 2017-11-06 NOTE — Telephone Encounter (Signed)
Pt returned phone call, pt contact # [phone]

## 2017-11-06 NOTE — Telephone Encounter (Signed)
Left detailed message for pt to let him know that they sent order over to [facility]. Nothing further is needed.

## 2017-11-06 NOTE — Telephone Encounter (Signed)
PCC's pt is calling stating that [facility] has told him that they have not received the nebulizer order for his machine. Please help.  Thanks

## 2017-11-11 ENCOUNTER — Telehealth: Payer: Self-pay | Admitting: Emergency Medicine

## 2017-11-11 NOTE — Telephone Encounter (Signed)
Noted  

## 2017-11-11 NOTE — Telephone Encounter (Signed)
Spoke with pt, advised someone should be calling him to set up nebulizer.

## 2017-11-11 NOTE — Telephone Encounter (Signed)
Called [facility] and spoke with a representative. They will call us back to discuss.

## 2017-11-11 NOTE — Telephone Encounter (Signed)
Spoke with [facility] and they state that the order was sent to [facility] [place]. The order has to be sent to [facility] in [place] [facility] [place] and faxed to the number below.   Fax [phone]

## 2017-11-11 NOTE — Telephone Encounter (Signed)
Spoke to RhinelandLorna @ [facility] I told her that the [facility] so a community message was sent to [facility] @ [facility] she sent a message back to me on 11/06/17 stated it was for [place] and tagged Ethelle LyonKim Sawyer in the message who works for [facility]. I have spoken to [place]orna she will contact the patient.

## 2017-11-26 ENCOUNTER — Ambulatory Visit: Payer: Medicare Other | Admitting: Internal Medicine

## 2017-11-27 ENCOUNTER — Telehealth: Payer: Self-pay | Admitting: Emergency Medicine

## 2017-11-27 MED ORDER — AZITHROMYCIN 250 MG PO TABS: ORAL_TABLET | ORAL | 0 refills | Status: AC

## 2017-11-27 NOTE — Telephone Encounter (Signed)
Please order azithromycin > Z pack. Have him call if not improving in 7-10 days

## 2017-11-27 NOTE — Telephone Encounter (Signed)
Called pt and advised message from the provider. Pt understood and verbalized understanding. Nothing further is needed.   Advised rx sent to pharmacy  

## 2017-11-27 NOTE — Telephone Encounter (Signed)
Spoke with pt, he thinks he has an upper respiratory infection and feels if he can get an ABX he can nip it in the bud. He is having cough with clear/yellow/brown mucus. Denies fever, some SOB but nothing out of the ordinary. SHe has taken Robitussin DM but no relief. Please advise  [facility]

## 2017-12-06 ENCOUNTER — Telehealth: Payer: Self-pay | Admitting: Emergency Medicine

## 2017-12-06 MED ORDER — UMECLIDINIUM-VILANTEROL 62.5-25 MCG/INH IN AEPB: 1.0000 | INHALATION_SPRAY | Freq: Every day | RESPIRATORY_TRACT | 0 refills | Status: AC

## 2017-12-06 NOTE — Telephone Encounter (Signed)
Can try just the atrovent component qid or change back to Anoro but not spiriva/anoro at the same time nor atrovent and anoro at the same time   Albuterol is not usually the cause of thrush so needs to be seen w/in a week if thrush not improving after change rx

## 2017-12-06 NOTE — Telephone Encounter (Signed)
Pt returned call. Informed him of the recs per MW. Pt is requesting to change back to Anoro. Two Anoro samples have been left up front for pick up. Pt verbalized understanding and denied any further questions or concerns at this time.

## 2017-12-06 NOTE — Telephone Encounter (Signed)
Called and spoke with pt who stated that he has begun to develop laryngitis from his albuterol neb sol.  Pt is wanting a different neb sol for him to try. Per Dr. Delton CoombesByrum in his OV note from 10/31/17, pt was to start albuterol/ipratropium nebulizers 4 times a day, but it is the albuterol that has given pt laryngitis.  Pt is also wanting an inhaled medication for him to try. Spiriva respimat was stopped at his last OV. Pt has tried symbicort in the past but that gave him laryngitis.  Pt stated to me that in the past when he did Spiriva and Anoro, he had no side effects from those inhalers.  Pt also stated that he has not tried Advair.  Dr. Sherene SiresWert, please advise on a different neb sol and an inhaled medication we can try for pt.  Thanks!

## 2017-12-06 NOTE — Telephone Encounter (Signed)
lmomtcb x1 

## 2017-12-19 ENCOUNTER — Telehealth: Payer: Self-pay | Admitting: Emergency Medicine

## 2017-12-19 NOTE — Telephone Encounter (Signed)
Pt is requesting a POC that goes up to 10lpm.  Per chart, pt uses 2lpm with exertion.  Pt states that this is incorrect, states that he uses 4lpm when riding a stationary bike and up to 6lpm when walking the treadmill/track.  Pt states that these O2 levels have been managed by pulm rehab.  I explained to pt that POCs that deliver that type of liter flow are much larger than the one he currently has.  Pt expressed understanding.  DME: [facility].  RB please advise if ok to order POC at pt request.  Thanks!

## 2017-12-20 NOTE — Telephone Encounter (Signed)
lmtcb x1 for pt. 

## 2017-12-20 NOTE — Telephone Encounter (Signed)
I am not sure that a portable oxygen concentrator exists that we will go to 10 L/min.  There may be one available, for example the kind that have wheels and can be pulled.  If so it is okay to try to order this device for him.  It sounds like based on his evaluation at pulmonary rehab we need to change his oxygen orders as follows.  2 L/min at rest and while walking, 4-6 L/min with heavier exertion.

## 2017-12-25 NOTE — Telephone Encounter (Signed)
Spoke with wife, she states pt is in the hospital right now for a blood clot. I advised her we could hold off on this for now to see if the hospital writes an order and most likely he will have to follow up with RB anyway and we can reassess what he needs. She agreed and will route to RB for FYI.

## 2017-12-29 IMAGING — CT CT CHEST HIGH RESOLUTION W/O CM
2 of 6 series · 14 of 36 positions shown, 17 images · non-contrast
Comparison: No priors.

CLINICAL DATA: 74-year-old male with history of worsening shortness
of breath with exertion over the past year.

EXAM:
CT CHEST WITHOUT CONTRAST
TECHNIQUE: Multidetector CT imaging of the chest was performed following the
standard protocol without intravenous contrast. High resolution
imaging of the lungs, as well as inspiratory and expiratory imaging,
was performed.

[Series 5: high resolution · axial · 0.76mm/px · z∈[+880,+1146]mm · 11 of 61 slices shown, 14 images]
[im 4/61  mediastinal]
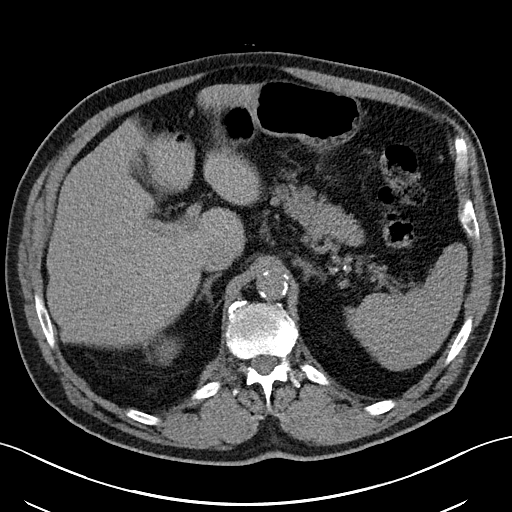
[im 4/61  lung]
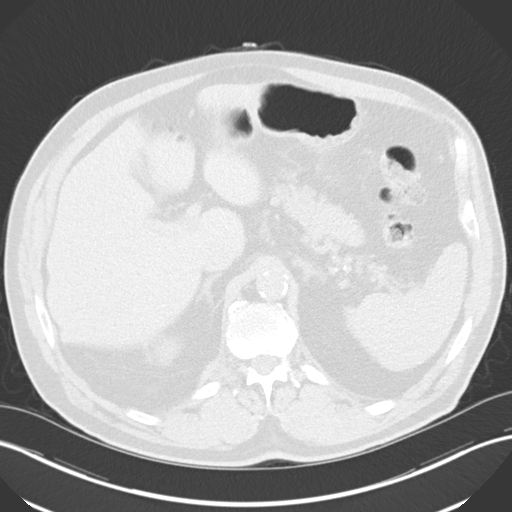
[im 12/61  lung]
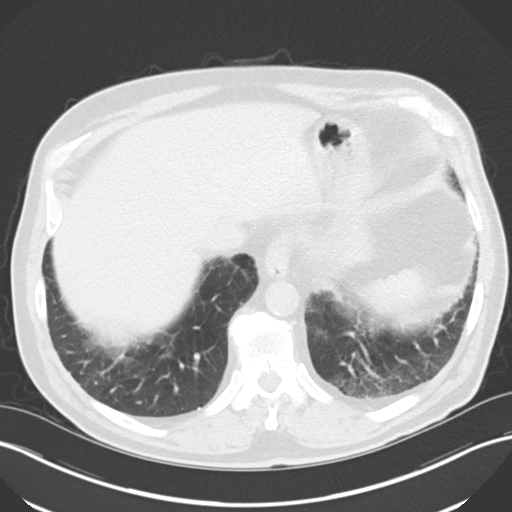
[im 16/61  lung]
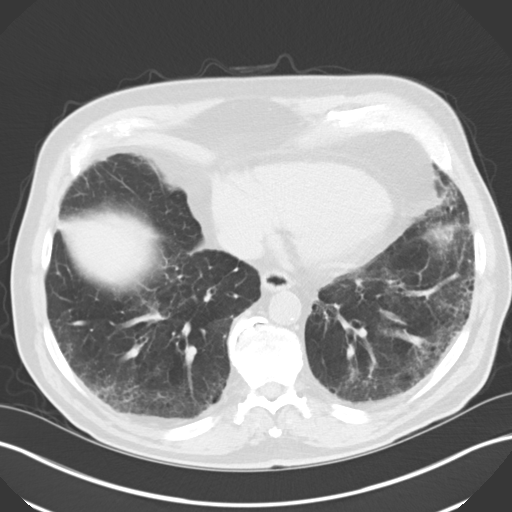
[im 19/61  lung]
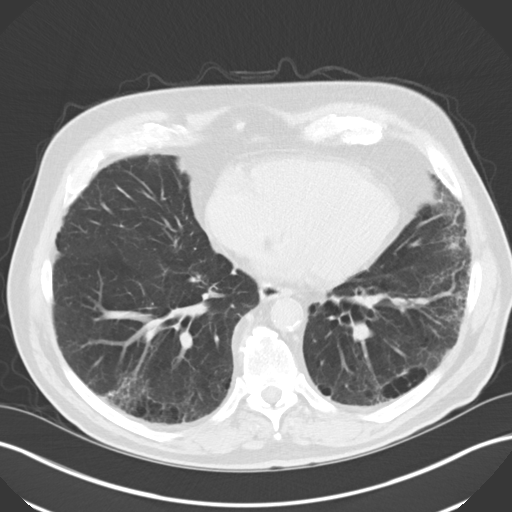
[im 27/61  mediastinal]
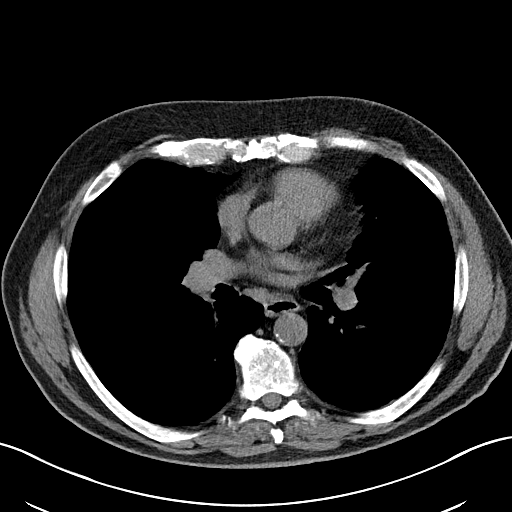
[im 27/61  lung]
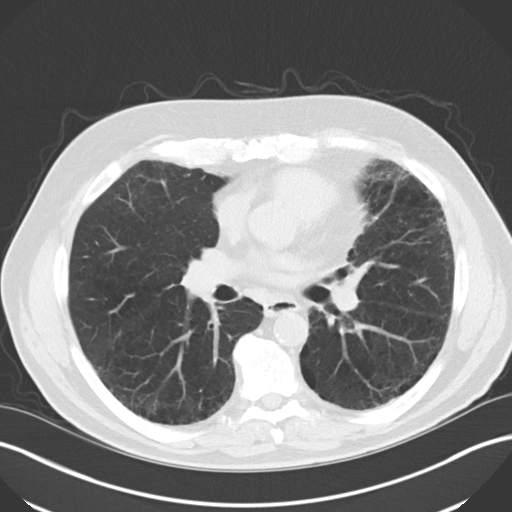
[im 31/61  lung]
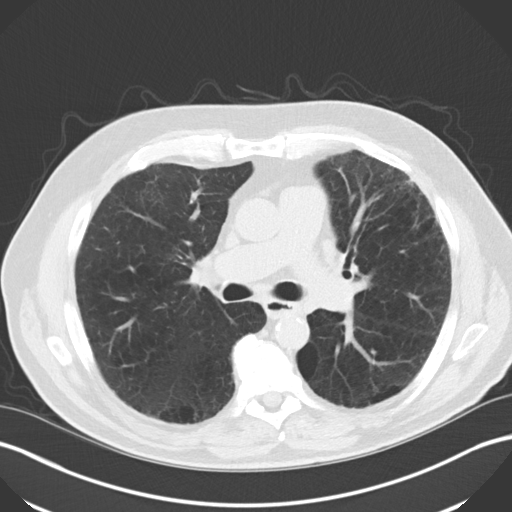
[im 34/61  lung]
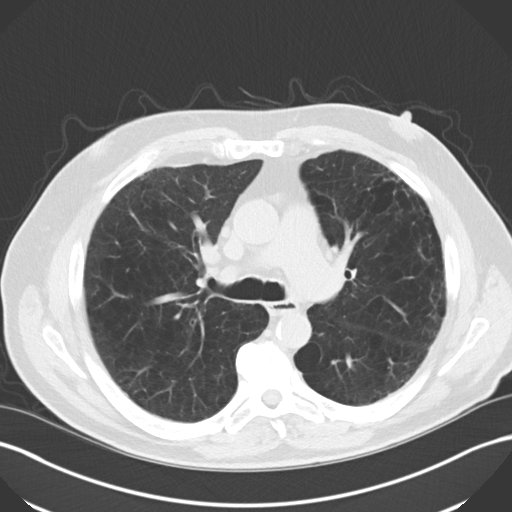
[im 42/61  lung]
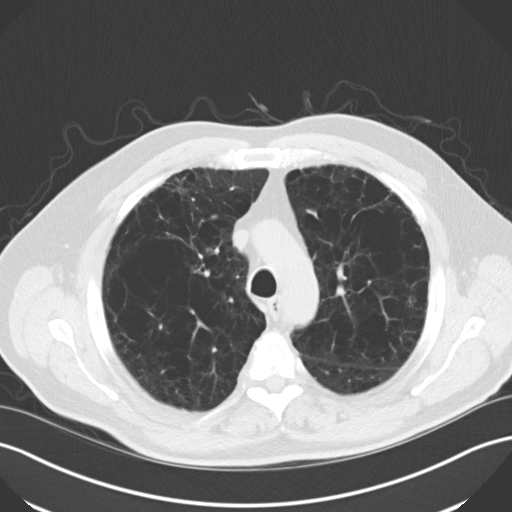
[im 46/61  mediastinal]
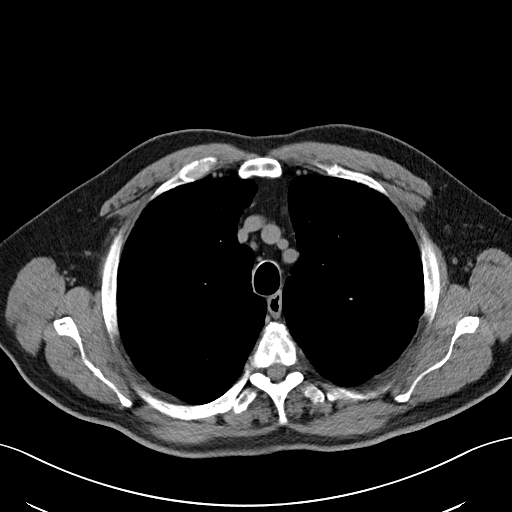
[im 46/61  lung]
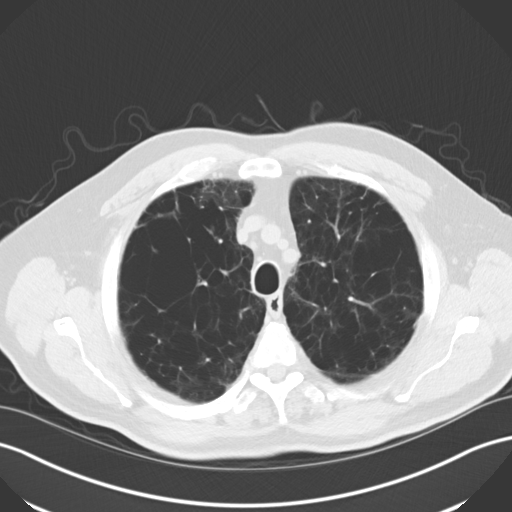
[im 49/61  lung]
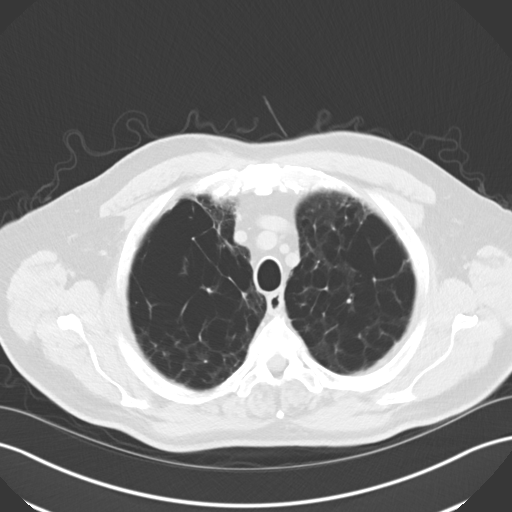
[im 57/61  lung]
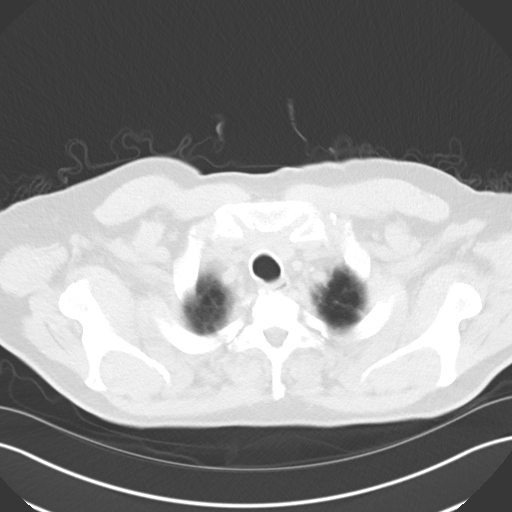

[Series 9: coronal · coronal · 0.59mm/px · 3 of 149 slices shown]
[im 30/149  lung]
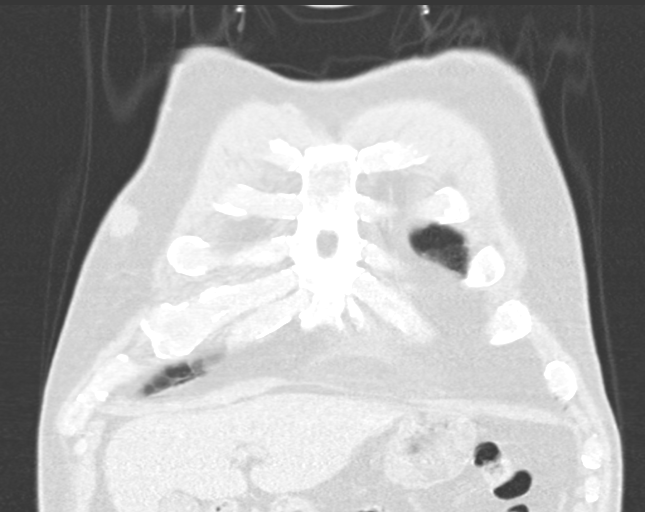
[im 60/149  lung]
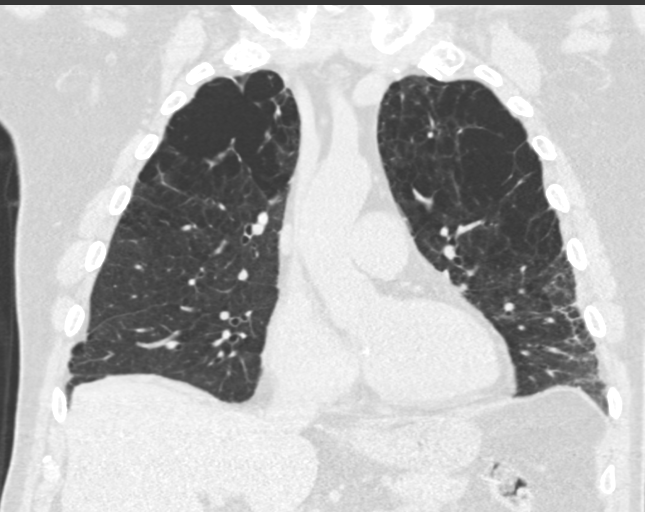
[im 89/149  lung]
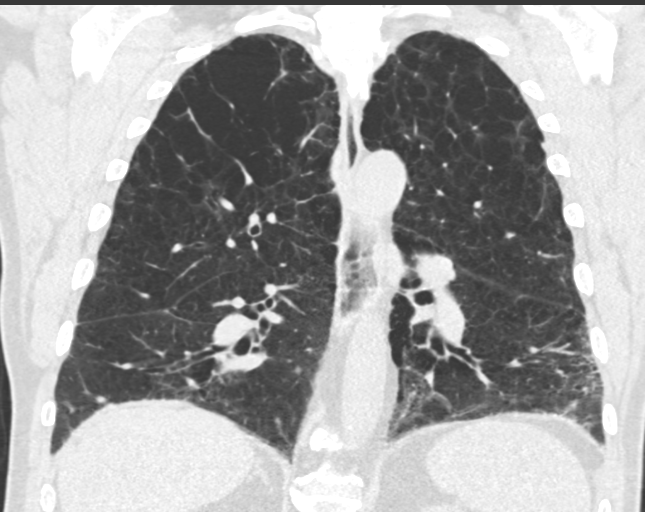

[14 of 36 positions shown; findings below may reference images not displayed]

FINDINGS: Mediastinum/Lymph Nodes: Heart size is normal. There is no
significant pericardial fluid, thickening or pericardial
calcification. There is atherosclerosis of the thoracic aorta, the
great vessels of the mediastinum and the coronary arteries,
including calcified atherosclerotic plaque in the left main, left
anterior descending and right coronary arteries. No pathologically
enlarged mediastinal or hilar lymph nodes. Please note that accurate
exclusion of hilar adenopathy is limited on noncontrast CT scans.
Esophagus is unremarkable in appearance. No axillary
lymphadenopathy.

Lungs/Pleura: High-resolution images do demonstrate some areas of
ground-glass attenuation, subpleural reticulation potential
honeycombing in the lung bases bilaterally. There is some mild
cylindrical traction bronchiectasis, most evident in the lower
lungs. Inspiratory and expiratory imaging is unremarkable. In
addition, there is diffuse bronchial wall thickening with severe
centrilobular and mild paraseptal emphysema. No acute consolidative
airspace disease. No pleural effusions.

Upper abdomen: Calcified granuloma in the right lobe of the liver
incidentally noted.

Musculoskeletal: There are no aggressive appearing lytic or blastic
lesions noted in the visualized portions of the skeleton.
IMPRESSION: 1. There is a spectrum of findings in the lungs concerning for
interstitial lung disease, and given the strong craniocaudal
gradient and the presence of what appears to be some early
honeycombing, findings are concerning for potential usual
interstitial pneumonia (UIP). Repeat high-resolution chest CT in 12
months would be useful to assess for temporal changes in the
appearance of the lung parenchyma if clinically appropriate.
2. In addition, there is diffuse bronchial wall thickening with
severe centrilobular and mild paraseptal emphysema; imaging findings
suggestive of underlying COPD.
3. Atherosclerosis, including left main and 2 vessel coronary artery
disease. Assessment for potential risk factor modification, dietary
therapy or pharmacologic therapy may be warranted, if clinically
indicated.

## 2018-01-02 ENCOUNTER — Inpatient Hospital Stay
Admission: EM | Admit: 2018-01-02 | Discharge: 2018-02-10 | Disposition: A | Discharge status: Still In Hospital | Payer: Self-pay | Source: Other Acute Inpatient Hospital | Attending: Internal Medicine | Admitting: Internal Medicine

## 2018-01-02 DIAGNOSIS — J189 Pneumonia, unspecified organism: Secondary | ICD-10-CM

## 2018-01-02 DIAGNOSIS — J9690 Respiratory failure, unspecified, unspecified whether with hypoxia or hypercapnia: Secondary | ICD-10-CM

## 2018-01-02 DIAGNOSIS — R0603 Acute respiratory distress: Secondary | ICD-10-CM

## 2018-01-02 MED ORDER — LACTULOSE 10 GM/15ML PO SOLN
20.00 | ORAL | Status: DC
Start: 2018-01-03 — End: 2018-01-02

## 2018-01-02 MED ORDER — NYSTATIN 100000 UNIT/ML MT SUSP
OROMUCOSAL | Status: DC
Start: 2018-01-02 — End: 2018-01-02

## 2018-01-02 MED ORDER — IPRATROPIUM-ALBUTEROL 0.5-2.5 (3) MG/3ML IN SOLN
3.00 | RESPIRATORY_TRACT | Status: DC
Start: ? — End: 2018-01-02

## 2018-01-02 MED ORDER — SODIUM CHLORIDE 0.9 % IV SOLN
INTRAVENOUS | Status: DC
Start: ? — End: 2018-01-02

## 2018-01-02 MED ORDER — HYDROCODONE-ACETAMINOPHEN 10-325 MG PO TABS
ORAL_TABLET | ORAL | Status: DC
Start: ? — End: 2018-01-02

## 2018-01-02 MED ORDER — BENZOCAINE-MENTHOL 15-3.6 MG MT LOZG
LOZENGE | OROMUCOSAL | Status: DC
Start: ? — End: 2018-01-02

## 2018-01-02 MED ORDER — GENERIC EXTERNAL MEDICATION
Status: DC
Start: ? — End: 2018-01-02

## 2018-01-02 MED ORDER — POLYETHYLENE GLYCOL 3350 17 G PO PACK
17.00 | PACK | ORAL | Status: DC
Start: 2018-01-03 — End: 2018-01-02

## 2018-01-02 MED ORDER — DOCUSATE SODIUM 100 MG PO CAPS
100.00 | ORAL_CAPSULE | ORAL | Status: DC
Start: 2018-01-02 — End: 2018-01-02

## 2018-01-02 MED ORDER — ALBUTEROL SULFATE (2.5 MG/3ML) 0.083% IN NEBU
2.50 | INHALATION_SOLUTION | RESPIRATORY_TRACT | Status: DC
Start: 2018-01-02 — End: 2018-01-02

## 2018-01-02 MED ORDER — APIXABAN 5 MG PO TABS
5.00 | ORAL_TABLET | ORAL | Status: DC
Start: 2018-01-02 — End: 2018-01-02

## 2018-01-02 MED ORDER — BUDESONIDE-FORMOTEROL FUMARATE 160-4.5 MCG/ACT IN AERO
INHALATION_SPRAY | RESPIRATORY_TRACT | Status: DC
Start: 2018-01-02 — End: 2018-01-02

## 2018-01-03 ENCOUNTER — Telehealth: Payer: Self-pay | Admitting: Emergency Medicine

## 2018-01-03 ENCOUNTER — Telehealth: Payer: Self-pay | Admitting: Internal Medicine

## 2018-01-03 ENCOUNTER — Other Ambulatory Visit (HOSPITAL_COMMUNITY): Payer: Self-pay

## 2018-01-03 LAB — COMPREHENSIVE METABOLIC PANEL
ALBUMIN: 2.9 g/dL — AB (ref 3.5–5.0)
ALT: 34 U/L (ref 17–63)
AST: 37 U/L (ref 15–41)
Alkaline Phosphatase: 76 U/L (ref 38–126)
Anion gap: 9 (ref 5–15)
BUN: 15 mg/dL (ref 6–20)
CHLORIDE: 103 mmol/L (ref 101–111)
CO2: 21 mmol/L — AB (ref 22–32)
Calcium: 8.7 mg/dL — ABNORMAL LOW (ref 8.9–10.3)
Creatinine, Ser: 0.84 mg/dL (ref 0.61–1.24)
GFR calc Af Amer: >60 mL/min (ref >60)
GLUCOSE: 95 mg/dL (ref 65–99)
POTASSIUM: 4.1 mmol/L (ref 3.5–5.1)
SODIUM: 133 mmol/L — AB (ref 135–145)
Total Bilirubin: 2 mg/dL — ABNORMAL HIGH (ref 0.3–1.2)
Total Protein: 6.7 g/dL (ref 6.5–8.1)

## 2018-01-03 LAB — CBC
HCT: 42.3 % (ref 39.0–52.0)
Hemoglobin: 14.2 g/dL (ref 13.0–17.0)
MCH: 33.6 pg (ref 26.0–34.0)
MCHC: 33.6 g/dL (ref 30.0–36.0)
MCV: 100.2 fL — ABNORMAL HIGH (ref 78.0–100.0)
PLATELETS: 247 10*3/uL (ref 150–400)
RBC: 4.22 MIL/uL (ref 4.22–5.81)
RDW: 15.6 % — AB (ref 11.5–15.5)
WBC: 15.1 10*3/uL — AB (ref 4.0–10.5)

## 2018-01-03 LAB — BLOOD GAS, ARTERIAL
Acid-base deficit: 1 mmol/L (ref 0.0–2.0)
Bicarbonate: 21.5 mmol/L (ref 20.0–28.0)
FIO2: 45
O2 CONTENT: 20 L/min
O2 Saturation: 90.8 %
Patient temperature: 98.6
pCO2 arterial: 25.9 mmHg — ABNORMAL LOW (ref 32.0–48.0)
pH, Arterial: 7.528 — ABNORMAL HIGH (ref 7.350–7.450)
pO2, Arterial: 55.6 mmHg — ABNORMAL LOW (ref 83.0–108.0)

## 2018-01-03 LAB — PROTIME-INR
INR: 1.43
PROTHROMBIN TIME: 17.4 s — AB (ref 11.4–15.2)

## 2018-01-03 LAB — MAGNESIUM: MAGNESIUM: 1.9 mg/dL (ref 1.7–2.4)

## 2018-01-03 LAB — APTT: APTT: 36 s (ref 24–36)

## 2018-01-03 MED ORDER — GENERIC EXTERNAL MEDICATION
Status: DC
Start: ? — End: 2018-01-03

## 2018-01-03 NOTE — Telephone Encounter (Signed)
Let them know that I believe that the admission and the location in the hospital are appropriate.

## 2018-01-03 NOTE — Telephone Encounter (Signed)
Spoke with pt. States that he is needing a prescription for Symbicort 160. He is currently in the hospital in [place]. They started him on this and he feels like this had made a difference.  RB - can we send in a prescription? Thanks.

## 2018-01-03 NOTE — Telephone Encounter (Signed)
Oh yes, this would be good, as this hospital is like a Long Term Care Facility that is important for patients with severe problems such as respiratory failure and possible need for ongoing ventilator or other treatment, CHF and other serious acute problems that normally need a more intensive treatment with a doctor every day over the weeks it takes to get better (this is normally care that does not necessarily have to occur in the regular hospital);  A regular nursing facility would not be good for this as patients are not seen every day, and this place is normally for patients who no longer need acute treatment

## 2018-01-03 NOTE — Telephone Encounter (Signed)
Called pt, LVM.   CRM created.  

## 2018-01-03 NOTE — Telephone Encounter (Signed)
Spoke with patients daughter-Dale Sewell Pt was in [facility] for blood clot in right leg, SOB and not breathing well with O2  Amada JupiterDale advised that patient has been transferred to [facility] last night on 5th floor east wing and wanting to make sure he was in correct specialty care. Patient was wanting a message sent to RB regarding pt has been transferred to [facility].  Routing to RB for review.

## 2018-01-03 NOTE — Telephone Encounter (Signed)
Derrick Petersen returned call, informed her of the note by Dr. Jonny RuizJohn 01/03/18 answering the questions asked about being at [facility] is best, she verbalized understanding.

## 2018-01-03 NOTE — Telephone Encounter (Signed)
Copied from CRM [id number]. Topic: Quick Communication - See Telephone Encounter >> Jan 03, 2018  8:15 AM Guinevere FerrariMorris, Amiley Shishido E, NT wrote: CRM for notification. See Telephone encounter for: Dawn patients's girlfriend is calling because patient wanted to ask the doctor if being at [facility] is the best for his condition. Pt would like a call back. [phone]  01/03/18.

## 2018-01-03 NOTE — Telephone Encounter (Signed)
Spoke with Amada JupiterDale and she states they moved the pt from [facility] [facility] to [facility] and he stated they did not have Symbicort. I advised Amada JupiterDale that we cannot bring him medications when he is admitted and that he should tell the doctor who admitted him to write an order for the nurse to administer this to him. She understood and will contact pt to let him know. Nothing further is needed.

## 2018-01-04 MED ORDER — GENERIC EXTERNAL MEDICATION
Status: DC
Start: ? — End: 2018-01-04

## 2018-01-06 LAB — CBC
HEMATOCRIT: 41 % (ref 39.0–52.0)
HEMOGLOBIN: 13.4 g/dL (ref 13.0–17.0)
MCH: 32.6 pg (ref 26.0–34.0)
MCHC: 32.7 g/dL (ref 30.0–36.0)
MCV: 99.8 fL (ref 78.0–100.0)
Platelets: 230 10*3/uL (ref 150–400)
RBC: 4.11 MIL/uL — ABNORMAL LOW (ref 4.22–5.81)
RDW: 15.5 % (ref 11.5–15.5)
WBC: 13.1 10*3/uL — AB (ref 4.0–10.5)

## 2018-01-06 LAB — BASIC METABOLIC PANEL
ANION GAP: 10 (ref 5–15)
BUN: 16 mg/dL (ref 6–20)
CALCIUM: 8.5 mg/dL — AB (ref 8.9–10.3)
CHLORIDE: 105 mmol/L (ref 101–111)
CO2: 21 mmol/L — AB (ref 22–32)
CREATININE: 0.83 mg/dL (ref 0.61–1.24)
GFR calc Af Amer: >60 mL/min (ref >60)
GFR calc non Af Amer: >60 mL/min (ref >60)
Glucose, Bld: 87 mg/dL (ref 65–99)
Potassium: 4.2 mmol/L (ref 3.5–5.1)
SODIUM: 136 mmol/L (ref 135–145)

## 2018-01-08 NOTE — Telephone Encounter (Signed)
I think he was admitted to the hospital.  

## 2018-01-09 LAB — RENAL FUNCTION PANEL
ANION GAP: 7 (ref 5–15)
Albumin: 2.8 g/dL — ABNORMAL LOW (ref 3.5–5.0)
BUN: 17 mg/dL (ref 6–20)
CHLORIDE: 104 mmol/L (ref 101–111)
CO2: 24 mmol/L (ref 22–32)
Calcium: 8.7 mg/dL — ABNORMAL LOW (ref 8.9–10.3)
Creatinine, Ser: 0.89 mg/dL (ref 0.61–1.24)
GFR calc non Af Amer: >60 mL/min (ref >60)
GLUCOSE: 101 mg/dL — AB (ref 65–99)
POTASSIUM: 4.5 mmol/L (ref 3.5–5.1)
Phosphorus: 3.2 mg/dL (ref 2.5–4.6)
Sodium: 135 mmol/L (ref 135–145)

## 2018-01-09 LAB — MAGNESIUM: Magnesium: 1.8 mg/dL (ref 1.7–2.4)

## 2018-01-09 LAB — CBC
HEMATOCRIT: 41.4 % (ref 39.0–52.0)
Hemoglobin: 14.1 g/dL (ref 13.0–17.0)
MCH: 34.1 pg — AB (ref 26.0–34.0)
MCHC: 34.1 g/dL (ref 30.0–36.0)
MCV: 100.2 fL — ABNORMAL HIGH (ref 78.0–100.0)
Platelets: 223 10*3/uL (ref 150–400)
RBC: 4.13 MIL/uL — ABNORMAL LOW (ref 4.22–5.81)
RDW: 15.7 % — AB (ref 11.5–15.5)
WBC: 12.3 10*3/uL — ABNORMAL HIGH (ref 4.0–10.5)

## 2018-01-11 LAB — CBC
HCT: 41.4 % (ref 39.0–52.0)
HEMOGLOBIN: 13.8 g/dL (ref 13.0–17.0)
MCH: 33.4 pg (ref 26.0–34.0)
MCHC: 33.3 g/dL (ref 30.0–36.0)
MCV: 100.2 fL — ABNORMAL HIGH (ref 78.0–100.0)
PLATELETS: 221 10*3/uL (ref 150–400)
RBC: 4.13 MIL/uL — AB (ref 4.22–5.81)
RDW: 15.7 % — ABNORMAL HIGH (ref 11.5–15.5)
WBC: 12 10*3/uL — AB (ref 4.0–10.5)

## 2018-01-11 LAB — BASIC METABOLIC PANEL
Anion gap: 10 (ref 5–15)
BUN: 15 mg/dL (ref 6–20)
CO2: 18 mmol/L — ABNORMAL LOW (ref 22–32)
Calcium: 8.4 mg/dL — ABNORMAL LOW (ref 8.9–10.3)
Chloride: 105 mmol/L (ref 101–111)
Creatinine, Ser: 1.07 mg/dL (ref 0.61–1.24)
Glucose, Bld: 96 mg/dL (ref 65–99)
POTASSIUM: 4 mmol/L (ref 3.5–5.1)
SODIUM: 133 mmol/L — AB (ref 135–145)

## 2018-01-12 ENCOUNTER — Other Ambulatory Visit (HOSPITAL_COMMUNITY): Payer: Self-pay

## 2018-01-12 MED ORDER — GENERIC EXTERNAL MEDICATION
Status: DC
Start: ? — End: 2018-01-12

## 2018-01-17 LAB — BASIC METABOLIC PANEL
ANION GAP: 13 (ref 5–15)
BUN: 20 mg/dL (ref 6–20)
CALCIUM: 9 mg/dL (ref 8.9–10.3)
CO2: 21 mmol/L — AB (ref 22–32)
CREATININE: 0.98 mg/dL (ref 0.61–1.24)
Chloride: 106 mmol/L (ref 101–111)
GFR calc Af Amer: >60 mL/min (ref >60)
GFR calc non Af Amer: >60 mL/min (ref >60)
GLUCOSE: 108 mg/dL — AB (ref 65–99)
Potassium: 4.3 mmol/L (ref 3.5–5.1)
Sodium: 140 mmol/L (ref 135–145)

## 2018-01-17 LAB — CBC
HEMATOCRIT: 46.3 % (ref 39.0–52.0)
Hemoglobin: 15.3 g/dL (ref 13.0–17.0)
MCH: 33.8 pg (ref 26.0–34.0)
MCHC: 33 g/dL (ref 30.0–36.0)
MCV: 102.2 fL — AB (ref 78.0–100.0)
Platelets: 211 10*3/uL (ref 150–400)
RBC: 4.53 MIL/uL (ref 4.22–5.81)
RDW: 15.8 % — AB (ref 11.5–15.5)
WBC: 15.4 10*3/uL — AB (ref 4.0–10.5)

## 2018-01-19 LAB — EXPECTORATED SPUTUM ASSESSMENT W GRAM STAIN, RFLX TO RESP C

## 2018-01-19 LAB — EXPECTORATED SPUTUM ASSESSMENT W REFEX TO RESP CULTURE

## 2018-01-21 ENCOUNTER — Other Ambulatory Visit (HOSPITAL_COMMUNITY): Payer: Self-pay

## 2018-01-21 LAB — CULTURE, RESPIRATORY W GRAM STAIN

## 2018-01-21 LAB — CBC
HEMATOCRIT: 51.1 % (ref 39.0–52.0)
HEMOGLOBIN: 16.7 g/dL (ref 13.0–17.0)
MCH: 34.1 pg — ABNORMAL HIGH (ref 26.0–34.0)
MCHC: 32.7 g/dL (ref 30.0–36.0)
MCV: 104.3 fL — ABNORMAL HIGH (ref 78.0–100.0)
Platelets: 219 10*3/uL (ref 150–400)
RBC: 4.9 MIL/uL (ref 4.22–5.81)
RDW: 15.4 % (ref 11.5–15.5)
WBC: 13.4 10*3/uL — AB (ref 4.0–10.5)

## 2018-01-21 LAB — BASIC METABOLIC PANEL
ANION GAP: 14 (ref 5–15)
BUN: 23 mg/dL — ABNORMAL HIGH (ref 6–20)
CALCIUM: 9.2 mg/dL (ref 8.9–10.3)
CHLORIDE: 102 mmol/L (ref 101–111)
CO2: 26 mmol/L (ref 22–32)
Creatinine, Ser: 1.2 mg/dL (ref 0.61–1.24)
GFR calc non Af Amer: 57 mL/min — ABNORMAL LOW (ref >60)
Glucose, Bld: 93 mg/dL (ref 65–99)
POTASSIUM: 4.5 mmol/L (ref 3.5–5.1)
Sodium: 142 mmol/L (ref 135–145)

## 2018-01-21 LAB — CULTURE, RESPIRATORY

## 2018-01-21 MED ORDER — GENERIC EXTERNAL MEDICATION
Status: DC
Start: ? — End: 2018-01-21

## 2018-01-23 LAB — BASIC METABOLIC PANEL
Anion gap: 13 (ref 5–15)
BUN: 29 mg/dL — AB (ref 6–20)
CHLORIDE: 101 mmol/L (ref 101–111)
CO2: 21 mmol/L — ABNORMAL LOW (ref 22–32)
CREATININE: 1.32 mg/dL — AB (ref 0.61–1.24)
Calcium: 8.9 mg/dL (ref 8.9–10.3)
GFR calc Af Amer: 59 mL/min — ABNORMAL LOW (ref >60)
GFR, EST NON AFRICAN AMERICAN: 51 mL/min — AB (ref >60)
GLUCOSE: 141 mg/dL — AB (ref 65–99)
POTASSIUM: 4.8 mmol/L (ref 3.5–5.1)
Sodium: 135 mmol/L (ref 135–145)

## 2018-01-23 LAB — CBC
HCT: 51.1 % (ref 39.0–52.0)
Hemoglobin: 17.2 g/dL — ABNORMAL HIGH (ref 13.0–17.0)
MCH: 34.5 pg — AB (ref 26.0–34.0)
MCHC: 33.7 g/dL (ref 30.0–36.0)
MCV: 102.4 fL — AB (ref 78.0–100.0)
PLATELETS: 225 10*3/uL (ref 150–400)
RBC: 4.99 MIL/uL (ref 4.22–5.81)
RDW: 15.6 % — ABNORMAL HIGH (ref 11.5–15.5)
WBC: 15 10*3/uL — ABNORMAL HIGH (ref 4.0–10.5)

## 2018-01-25 LAB — CBC
HEMATOCRIT: 49.1 % (ref 39.0–52.0)
HEMOGLOBIN: 16.4 g/dL (ref 13.0–17.0)
MCH: 33.9 pg (ref 26.0–34.0)
MCHC: 33.4 g/dL (ref 30.0–36.0)
MCV: 101.4 fL — AB (ref 78.0–100.0)
Platelets: 200 10*3/uL (ref 150–400)
RBC: 4.84 MIL/uL (ref 4.22–5.81)
RDW: 15.2 % (ref 11.5–15.5)
WBC: 17.2 10*3/uL — AB (ref 4.0–10.5)

## 2018-01-25 LAB — RENAL FUNCTION PANEL
ALBUMIN: 2.8 g/dL — AB (ref 3.5–5.0)
ANION GAP: 12 (ref 5–15)
BUN: 29 mg/dL — ABNORMAL HIGH (ref 6–20)
CO2: 24 mmol/L (ref 22–32)
Calcium: 8.7 mg/dL — ABNORMAL LOW (ref 8.9–10.3)
Chloride: 102 mmol/L (ref 101–111)
Creatinine, Ser: 1.27 mg/dL — ABNORMAL HIGH (ref 0.61–1.24)
GFR, EST NON AFRICAN AMERICAN: 54 mL/min — AB (ref >60)
Glucose, Bld: 137 mg/dL — ABNORMAL HIGH (ref 65–99)
PHOSPHORUS: 2.9 mg/dL (ref 2.5–4.6)
POTASSIUM: 4.9 mmol/L (ref 3.5–5.1)
Sodium: 138 mmol/L (ref 135–145)

## 2018-01-25 LAB — MAGNESIUM: MAGNESIUM: 2 mg/dL (ref 1.7–2.4)

## 2018-01-27 LAB — BLOOD GAS, ARTERIAL
ACID-BASE DEFICIT: 1.5 mmol/L (ref 0.0–2.0)
BICARBONATE: 21 mmol/L (ref 20.0–28.0)
O2 CONTENT: 15 L/min
O2 SAT: 88.7 %
Patient temperature: 98.6
pCO2 arterial: 25.3 mmHg — ABNORMAL LOW (ref 32.0–48.0)
pH, Arterial: 7.528 — ABNORMAL HIGH (ref 7.350–7.450)
pO2, Arterial: 55.3 mmHg — ABNORMAL LOW (ref 83.0–108.0)

## 2018-01-29 ENCOUNTER — Other Ambulatory Visit (HOSPITAL_COMMUNITY): Payer: Self-pay

## 2018-01-29 ENCOUNTER — Other Ambulatory Visit: Payer: Self-pay

## 2018-01-29 LAB — MAGNESIUM: MAGNESIUM: 2.1 mg/dL (ref 1.7–2.4)

## 2018-01-29 LAB — BLOOD GAS, ARTERIAL
ACID-BASE DEFICIT: 0.8 mmol/L (ref 0.0–2.0)
BICARBONATE: 22 mmol/L (ref 20.0–28.0)
FIO2: 100
O2 Saturation: 93.3 %
PCO2 ART: 28.1 mmHg — AB (ref 32.0–48.0)
PH ART: 7.504 — AB (ref 7.350–7.450)
PO2 ART: 67.4 mmHg — AB (ref 83.0–108.0)
Patient temperature: 98.6

## 2018-01-29 LAB — BASIC METABOLIC PANEL
Anion gap: 13 (ref 5–15)
BUN: 28 mg/dL — AB (ref 6–20)
CO2: 22 mmol/L (ref 22–32)
CREATININE: 1.18 mg/dL (ref 0.61–1.24)
Calcium: 8.6 mg/dL — ABNORMAL LOW (ref 8.9–10.3)
Chloride: 102 mmol/L (ref 101–111)
GFR calc Af Amer: >60 mL/min (ref >60)
GFR calc non Af Amer: 59 mL/min — ABNORMAL LOW (ref >60)
Glucose, Bld: 93 mg/dL (ref 65–99)
Potassium: 4.7 mmol/L (ref 3.5–5.1)
SODIUM: 137 mmol/L (ref 135–145)

## 2018-01-29 LAB — CBC
HCT: 51.5 % (ref 39.0–52.0)
Hemoglobin: 17.2 g/dL — ABNORMAL HIGH (ref 13.0–17.0)
MCH: 33.8 pg (ref 26.0–34.0)
MCHC: 33.4 g/dL (ref 30.0–36.0)
MCV: 101.2 fL — ABNORMAL HIGH (ref 78.0–100.0)
PLATELETS: 155 10*3/uL (ref 150–400)
RBC: 5.09 MIL/uL (ref 4.22–5.81)
RDW: 15.5 % (ref 11.5–15.5)
WBC: 15.2 10*3/uL — ABNORMAL HIGH (ref 4.0–10.5)

## 2018-01-29 LAB — CK TOTAL AND CKMB (NOT AT ARMC)
CK, MB: 4.9 ng/mL (ref 0.5–5.0)
CK, MB: 5.3 ng/mL — AB (ref 0.5–5.0)
RELATIVE INDEX: INVALID (ref 0.0–2.5)
Relative Index: INVALID (ref 0.0–2.5)
Total CK: 46 U/L — ABNORMAL LOW (ref 49–397)
Total CK: 62 U/L (ref 49–397)

## 2018-01-29 LAB — TROPONIN I
TROPONIN I: 0.04 ng/mL — AB (ref <0.03)
Troponin I: 0.03 ng/mL (ref <0.03)

## 2018-01-30 LAB — CK TOTAL AND CKMB (NOT AT ARMC)
CK, MB: 5.9 ng/mL — ABNORMAL HIGH (ref 0.5–5.0)
Relative Index: INVALID (ref 0.0–2.5)
Total CK: 52 U/L (ref 49–397)

## 2018-01-30 LAB — TROPONIN I: Troponin I: 0.03 ng/mL (ref <0.03)

## 2018-01-31 LAB — BASIC METABOLIC PANEL
ANION GAP: 13 (ref 5–15)
BUN: 32 mg/dL — AB (ref 6–20)
CO2: 25 mmol/L (ref 22–32)
Calcium: 9 mg/dL (ref 8.9–10.3)
Chloride: 100 mmol/L — ABNORMAL LOW (ref 101–111)
Creatinine, Ser: 1.36 mg/dL — ABNORMAL HIGH (ref 0.61–1.24)
GFR calc Af Amer: 57 mL/min — ABNORMAL LOW (ref >60)
GFR, EST NON AFRICAN AMERICAN: 49 mL/min — AB (ref >60)
Glucose, Bld: 96 mg/dL (ref 65–99)
Potassium: 4.7 mmol/L (ref 3.5–5.1)
SODIUM: 138 mmol/L (ref 135–145)

## 2018-02-02 LAB — BASIC METABOLIC PANEL
Anion gap: 13 (ref 5–15)
BUN: 35 mg/dL — ABNORMAL HIGH (ref 6–20)
CHLORIDE: 98 mmol/L — AB (ref 101–111)
CO2: 25 mmol/L (ref 22–32)
Calcium: 8.8 mg/dL — ABNORMAL LOW (ref 8.9–10.3)
Creatinine, Ser: 1.37 mg/dL — ABNORMAL HIGH (ref 0.61–1.24)
GFR calc non Af Amer: 49 mL/min — ABNORMAL LOW (ref >60)
GFR, EST AFRICAN AMERICAN: 57 mL/min — AB (ref >60)
Glucose, Bld: 113 mg/dL — ABNORMAL HIGH (ref 65–99)
POTASSIUM: 4.9 mmol/L (ref 3.5–5.1)
SODIUM: 136 mmol/L (ref 135–145)

## 2018-02-02 LAB — CBC
HCT: 53.8 % — ABNORMAL HIGH (ref 39.0–52.0)
HEMOGLOBIN: 17.9 g/dL — AB (ref 13.0–17.0)
MCH: 34 pg (ref 26.0–34.0)
MCHC: 33.3 g/dL (ref 30.0–36.0)
MCV: 102.3 fL — ABNORMAL HIGH (ref 78.0–100.0)
Platelets: 123 10*3/uL — ABNORMAL LOW (ref 150–400)
RBC: 5.26 MIL/uL (ref 4.22–5.81)
RDW: 16.1 % — ABNORMAL HIGH (ref 11.5–15.5)
WBC: 14.5 10*3/uL — ABNORMAL HIGH (ref 4.0–10.5)

## 2018-02-03 LAB — RENAL FUNCTION PANEL
ALBUMIN: 2.9 g/dL — AB (ref 3.5–5.0)
ANION GAP: 13 (ref 5–15)
Albumin: 3 g/dL — ABNORMAL LOW (ref 3.5–5.0)
Anion gap: 10 (ref 5–15)
BUN: 34 mg/dL — ABNORMAL HIGH (ref 6–20)
BUN: 32 mg/dL — AB (ref 6–20)
CALCIUM: 8.8 mg/dL — AB (ref 8.9–10.3)
CHLORIDE: 102 mmol/L (ref 101–111)
CO2: 25 mmol/L (ref 22–32)
CO2: 26 mmol/L (ref 22–32)
CREATININE: 1.41 mg/dL — AB (ref 0.61–1.24)
Calcium: 9.3 mg/dL (ref 8.9–10.3)
Chloride: 105 mmol/L (ref 101–111)
Creatinine, Ser: 1.4 mg/dL — ABNORMAL HIGH (ref 0.61–1.24)
GFR calc non Af Amer: 48 mL/min — ABNORMAL LOW (ref >60)
GFR, EST AFRICAN AMERICAN: 55 mL/min — AB (ref >60)
GFR, EST AFRICAN AMERICAN: 55 mL/min — AB (ref >60)
GFR, EST NON AFRICAN AMERICAN: 47 mL/min — AB (ref >60)
GLUCOSE: 107 mg/dL — AB (ref 65–99)
Glucose, Bld: 74 mg/dL (ref 65–99)
POTASSIUM: 6 mmol/L — AB (ref 3.5–5.1)
Phosphorus: 3.3 mg/dL (ref 2.5–4.6)
Phosphorus: 3.3 mg/dL (ref 2.5–4.6)
Potassium: 4.1 mmol/L (ref 3.5–5.1)
SODIUM: 141 mmol/L (ref 135–145)
Sodium: 140 mmol/L (ref 135–145)

## 2018-02-03 LAB — CBC
HCT: 53.9 % — ABNORMAL HIGH (ref 39.0–52.0)
HEMOGLOBIN: 18.2 g/dL — AB (ref 13.0–17.0)
MCH: 34.7 pg — AB (ref 26.0–34.0)
MCHC: 33.8 g/dL (ref 30.0–36.0)
MCV: 102.7 fL — AB (ref 78.0–100.0)
Platelets: 113 10*3/uL — ABNORMAL LOW (ref 150–400)
RBC: 5.25 MIL/uL (ref 4.22–5.81)
RDW: 16.1 % — ABNORMAL HIGH (ref 11.5–15.5)
WBC: 13.1 10*3/uL — ABNORMAL HIGH (ref 4.0–10.5)

## 2018-02-03 LAB — T4, FREE: FREE T4: 1.19 ng/dL — AB (ref 0.61–1.12)

## 2018-02-03 LAB — TSH: TSH: 1.817 u[IU]/mL (ref 0.350–4.500)

## 2018-02-03 LAB — MAGNESIUM: Magnesium: 2.4 mg/dL (ref 1.7–2.4)

## 2018-02-04 LAB — RENAL FUNCTION PANEL
ALBUMIN: 3.1 g/dL — AB (ref 3.5–5.0)
Anion gap: 16 — ABNORMAL HIGH (ref 5–15)
BUN: 32 mg/dL — AB (ref 6–20)
CALCIUM: 9.1 mg/dL (ref 8.9–10.3)
CO2: 24 mmol/L (ref 22–32)
CREATININE: 1.4 mg/dL — AB (ref 0.61–1.24)
Chloride: 102 mmol/L (ref 101–111)
GFR calc Af Amer: 55 mL/min — ABNORMAL LOW (ref >60)
GFR calc non Af Amer: 48 mL/min — ABNORMAL LOW (ref >60)
GLUCOSE: 101 mg/dL — AB (ref 65–99)
PHOSPHORUS: 4 mg/dL (ref 2.5–4.6)
Potassium: 4.6 mmol/L (ref 3.5–5.1)
SODIUM: 142 mmol/L (ref 135–145)

## 2018-02-04 LAB — CBC
HEMATOCRIT: 59.5 % — AB (ref 39.0–52.0)
Hemoglobin: 20.1 g/dL — ABNORMAL HIGH (ref 13.0–17.0)
MCH: 34.8 pg — ABNORMAL HIGH (ref 26.0–34.0)
MCHC: 33.8 g/dL (ref 30.0–36.0)
MCV: 102.9 fL — ABNORMAL HIGH (ref 78.0–100.0)
Platelets: 92 10*3/uL — ABNORMAL LOW (ref 150–400)
RBC: 5.78 MIL/uL (ref 4.22–5.81)
RDW: 16.3 % — AB (ref 11.5–15.5)
WBC: 15.1 10*3/uL — AB (ref 4.0–10.5)

## 2018-02-04 LAB — MAGNESIUM: MAGNESIUM: 2.3 mg/dL (ref 1.7–2.4)

## 2018-02-05 ENCOUNTER — Other Ambulatory Visit (HOSPITAL_COMMUNITY): Payer: Self-pay

## 2018-02-05 LAB — CBC
HCT: 55.7 % — ABNORMAL HIGH (ref 39.0–52.0)
Hemoglobin: 18.2 g/dL — ABNORMAL HIGH (ref 13.0–17.0)
MCH: 33.2 pg (ref 26.0–34.0)
MCHC: 32.7 g/dL (ref 30.0–36.0)
MCV: 101.6 fL — ABNORMAL HIGH (ref 78.0–100.0)
Platelets: 88 10*3/uL — ABNORMAL LOW (ref 150–400)
RBC: 5.48 MIL/uL (ref 4.22–5.81)
RDW: 16 % — ABNORMAL HIGH (ref 11.5–15.5)
WBC: 14.2 10*3/uL — ABNORMAL HIGH (ref 4.0–10.5)

## 2018-02-05 LAB — RENAL FUNCTION PANEL
Albumin: 2.7 g/dL — ABNORMAL LOW (ref 3.5–5.0)
Anion gap: 17 — ABNORMAL HIGH (ref 5–15)
BUN: 33 mg/dL — AB (ref 6–20)
CALCIUM: 8.7 mg/dL — AB (ref 8.9–10.3)
CO2: 21 mmol/L — AB (ref 22–32)
Chloride: 104 mmol/L (ref 101–111)
Creatinine, Ser: 1.26 mg/dL — ABNORMAL HIGH (ref 0.61–1.24)
GFR calc non Af Amer: 54 mL/min — ABNORMAL LOW (ref >60)
GLUCOSE: 113 mg/dL — AB (ref 65–99)
Phosphorus: 3.2 mg/dL (ref 2.5–4.6)
Potassium: 4.9 mmol/L (ref 3.5–5.1)
SODIUM: 142 mmol/L (ref 135–145)

## 2018-02-05 LAB — MAGNESIUM: Magnesium: 2.2 mg/dL (ref 1.7–2.4)

## 2018-02-06 LAB — RENAL FUNCTION PANEL
ALBUMIN: 2.7 g/dL — AB (ref 3.5–5.0)
Anion gap: 15 (ref 5–15)
BUN: 33 mg/dL — AB (ref 6–20)
CALCIUM: 8.7 mg/dL — AB (ref 8.9–10.3)
CO2: 24 mmol/L (ref 22–32)
CREATININE: 1.18 mg/dL (ref 0.61–1.24)
Chloride: 103 mmol/L (ref 101–111)
GFR, EST NON AFRICAN AMERICAN: 59 mL/min — AB (ref >60)
Glucose, Bld: 114 mg/dL — ABNORMAL HIGH (ref 65–99)
Phosphorus: 3 mg/dL (ref 2.5–4.6)
Potassium: 4.7 mmol/L (ref 3.5–5.1)
SODIUM: 142 mmol/L (ref 135–145)

## 2018-02-06 LAB — MAGNESIUM: MAGNESIUM: 2.3 mg/dL (ref 1.7–2.4)

## 2018-02-08 ENCOUNTER — Other Ambulatory Visit (HOSPITAL_COMMUNITY): Payer: Self-pay

## 2018-02-08 LAB — CBC
HCT: 57 % — ABNORMAL HIGH (ref 39.0–52.0)
Hemoglobin: 18.5 g/dL — ABNORMAL HIGH (ref 13.0–17.0)
MCH: 33.8 pg (ref 26.0–34.0)
MCHC: 32.5 g/dL (ref 30.0–36.0)
MCV: 104.2 fL — ABNORMAL HIGH (ref 78.0–100.0)
PLATELETS: 99 10*3/uL — AB (ref 150–400)
RBC: 5.47 MIL/uL (ref 4.22–5.81)
RDW: 16.2 % — AB (ref 11.5–15.5)
WBC: 15.1 10*3/uL — ABNORMAL HIGH (ref 4.0–10.5)

## 2018-02-08 LAB — RENAL FUNCTION PANEL
ALBUMIN: 2.9 g/dL — AB (ref 3.5–5.0)
Anion gap: 14 (ref 5–15)
BUN: 37 mg/dL — AB (ref 6–20)
CALCIUM: 8.6 mg/dL — AB (ref 8.9–10.3)
CO2: 23 mmol/L (ref 22–32)
CREATININE: 1.17 mg/dL (ref 0.61–1.24)
Chloride: 104 mmol/L (ref 101–111)
GFR calc Af Amer: >60 mL/min (ref >60)
GFR calc non Af Amer: 59 mL/min — ABNORMAL LOW (ref >60)
Glucose, Bld: 89 mg/dL (ref 65–99)
PHOSPHORUS: 3.1 mg/dL (ref 2.5–4.6)
Potassium: 5.5 mmol/L — ABNORMAL HIGH (ref 3.5–5.1)
SODIUM: 141 mmol/L (ref 135–145)

## 2018-02-08 LAB — POTASSIUM: Potassium: 6.6 mmol/L (ref 3.5–5.1)

## 2018-02-08 LAB — MAGNESIUM: MAGNESIUM: 2.3 mg/dL (ref 1.7–2.4)

## 2018-02-09 ENCOUNTER — Ambulatory Visit: Payer: Self-pay | Admitting: Otolaryngology

## 2018-02-09 LAB — BASIC METABOLIC PANEL
ANION GAP: 14 (ref 5–15)
BUN: 40 mg/dL — ABNORMAL HIGH (ref 6–20)
CHLORIDE: 105 mmol/L (ref 101–111)
CO2: 23 mmol/L (ref 22–32)
Calcium: 8.4 mg/dL — ABNORMAL LOW (ref 8.9–10.3)
Creatinine, Ser: 1.25 mg/dL — ABNORMAL HIGH (ref 0.61–1.24)
GFR calc Af Amer: >60 mL/min (ref >60)
GFR calc non Af Amer: 55 mL/min — ABNORMAL LOW (ref >60)
GLUCOSE: 105 mg/dL — AB (ref 65–99)
POTASSIUM: 4.4 mmol/L (ref 3.5–5.1)
Sodium: 142 mmol/L (ref 135–145)

## 2018-02-09 LAB — HEPARIN LEVEL (UNFRACTIONATED): Heparin Unfractionated: 0.63 IU/mL (ref 0.30–0.70)

## 2018-02-09 LAB — CBC
HEMATOCRIT: 54.1 % — AB (ref 39.0–52.0)
Hemoglobin: 17.9 g/dL — ABNORMAL HIGH (ref 13.0–17.0)
MCH: 34.4 pg — ABNORMAL HIGH (ref 26.0–34.0)
MCHC: 33.1 g/dL (ref 30.0–36.0)
MCV: 103.8 fL — ABNORMAL HIGH (ref 78.0–100.0)
Platelets: 88 10*3/uL — ABNORMAL LOW (ref 150–400)
RBC: 5.21 MIL/uL (ref 4.22–5.81)
RDW: 16.5 % — AB (ref 11.5–15.5)
WBC: 17.9 10*3/uL — AB (ref 4.0–10.5)

## 2018-02-09 LAB — PROTIME-INR
INR: 1.41
INR: 1.52
PROTHROMBIN TIME: 18.1 s — AB (ref 11.4–15.2)
Prothrombin Time: 17.2 seconds — ABNORMAL HIGH (ref 11.4–15.2)

## 2018-02-09 LAB — APTT: aPTT: 27 seconds (ref 24–36)

## 2018-02-09 NOTE — H&P (View-Only) (Signed)
PREOPERATIVE H&P  Chief Complaint: pulmonary fibrosis with chronic O2 dependence  HPI: Derrick Petersen. is a 77 y.o. 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 He is on chronic BiPAP high flow O2 transnasally. He's had recent history of PE and is on Eliquis. I was consulted for consideration of placement of a trach in order to place BiPAP therapy directly into the trachea versus transnasally as this may reduces O2 and flow rates. Patient has end-stage lung disease and is acceptable to this plan. He is no CODE STATUS. He is taken to the operating room for tracheotomy under local anesthetic. He understands the risk of the procedure.  Past Medical History:  Diagnosis Date  . Chronic hypoxemic respiratory failure (HCC) 05/23/2017  . COLONIC POLYPS, HX OF 07/26/2009  . FATIGUE 07/26/2009  . GLUCOSE INTOLERANCE 07/26/2009  . HYPERLIPIDEMIA 07/26/2009  . HYPERTENSION 07/26/2009  . Impaired glucose tolerance 08/05/2011  . OTITIS EXTERNA 11/28/2009  . 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 Social History   Socioeconomic History  . Marital status: Divorced    Spouse name: Not on file  . Number of children: 0  . Years of education: Not on file  . Highest education level: Not on file  Social Needs  . Financial resource strain: Not on file  . Food insecurity - worry: Not on file  . Food insecurity - inability: Not on file  . Transportation needs - medical: Not on file  . Transportation needs - non-medical: Not on file  Occupational History  . Occupation: retired [employer].   Tobacco Use  . Smoking status: Former Smoker    Packs/day: 3.00    Years: 34.00    Pack years: 102.00    Types: Cigarettes    Last attempt to quit: 12/31/1988    Years since quitting: 29.1  . Smokeless  tobacco: Never Used  Substance and Sexual Activity  . Alcohol use: Yes    Alcohol/week: 1.8 oz    Types: 3 Cans of beer per week    Comment: occasional  . Drug use: No  . Sexual activity: Not on file  Other Topics Concern  . Not on file  Social History Narrative   Hobby computer light wts.   Family History  Problem Relation Age of Onset  . Hypertension Mother   . Colon cancer Father   . Hypertension Father   . Diabetes Other    No Known Allergies Prior to Admission medications   Medication Sig Start Date End Date Taking? Authorizing Provider  albuterol (PROAIR HFA) 108 (90 Base) MCG/ACT inhaler Inhale 2 puffs into the lungs every 6 (six) hours as needed for wheezing or shortness of breath. 05/22/17   Corwin Levins, MD  azithromycin (ZITHROMAX) 250 MG tablet Take 2 pills today then one a day for 4 additional days 11/27/17   Leslye Peer, MD  B Complex-C (SUPER B COMPLEX PO) Take by mouth daily.      [provider]  COD LIVER OIL PO Take by mouth daily.    [provider]  doxycycline (VIBRA-TABS) 100 MG tablet Take 1 tablet (100 mg total) by mouth 2 (two) times daily. 09/11/17   Leslye Peer, MD  ferrous gluconate (FERGON) 325 MG tablet Take 325 mg by mouth daily as needed.     [provider]  Fish Oil-Cholecalciferol (FISH OIL + D3 PO) Take by mouth daily.    [provider]  gabapentin (NEURONTIN) 300 MG capsule Take 1 capsule (300 mg total) by mouth at bedtime. 05/22/17   Corwin LevinsJohn, James W, MD  glucosamine-chondroitin 500-400 MG tablet Take 2 tablets by mouth daily.     [provider]  hydrochlorothiazide (HYDRODIURIL) 25 MG tablet Take 1 tablet (25 mg total) by mouth daily. 05/22/17   Corwin LevinsJohn, James W, MD  ipratropium-albuterol (DUONEB) 0.5-2.5 (3) MG/3ML SOLN Take 3 mLs by nebulization 4 (four) times daily. 10/31/17   Leslye PeerByrum, Robert S, MD  loratadine (CLARITIN) 10 MG tablet Take 1 tablet (10 mg total) by mouth daily. 05/22/17   Corwin LevinsJohn, James  W, MD  Magnesium 300 MG CAPS Take by mouth daily.      [provider]  Multiple Vitamin (MULTIVITAMIN) capsule Take 1 capsule by mouth daily.      [provider]  Selenium 200 MCG TABS Take 1 tablet by mouth daily.    [provider]  Turmeric 450 MG CAPS Take 1 capsule by mouth 2 (two) times daily.    [provider]  umeclidinium-vilanterol (ANORO ELLIPTA) 62.5-25 MCG/INH AEPB Inhale 1 puff into the lungs daily. 12/06/17   Nyoka CowdenWert, Michael B, MD  valsartan (DIOVAN) 80 MG tablet Take 1 tablet (80 mg total) by mouth daily. 10/31/17   Leslye PeerByrum, Robert S, MD     Positive ROS: negative  All other systems have been reviewed and were otherwise negative with the exception of those mentioned in the HPI and as above.  Physical Exam: There were no vitals filed for this visit.  General: Alert, no acute distress, wears BiPAP nasal cannula O2 Oral: Normal oral mucosa and tonsils Nasal: Clear nasal passages Neck: No palpable adenopathy or thyroid nodules. Thin neck without masses. Trachea midline Cardiovascular: Regular rate and rhythm, no murmur.  Respiratory: Clear to auscultation Neurologic: Alert and oriented x 3   Assessment/Plan: RESPIRATORY FAILURE Plan for Procedure(s): TRACHEOSTOMY   Dillard Cannonhristopher Kingsten Enfield, MD 02/09/2018 3:00 PM

## 2018-02-09 NOTE — H&P (Signed)
PREOPERATIVE H&P  Chief Complaint: pulmonary fibrosis with chronic O2 dependence  HPI: Derrick Petersen. is a 77 y.o. 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 He is on chronic BiPAP high flow O2 transnasally. He's had recent history of PE and is on Eliquis. I was consulted for consideration of placement of a trach in order to place BiPAP therapy directly into the trachea versus transnasally as this may reduces O2 and flow rates. Patient has end-stage lung disease and is acceptable to this plan. He is no CODE STATUS. He is taken to the operating room for tracheotomy under local anesthetic. He understands the risk of the procedure.  Past Medical History:  Diagnosis Date  . Chronic hypoxemic respiratory failure (HCC) 05/23/2017  . COLONIC POLYPS, HX OF 07/26/2009  . FATIGUE 07/26/2009  . GLUCOSE INTOLERANCE 07/26/2009  . HYPERLIPIDEMIA 07/26/2009  . HYPERTENSION 07/26/2009  . Impaired glucose tolerance 08/05/2011  . OTITIS EXTERNA 11/28/2009  . 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 Social History   Socioeconomic History  . Marital status: Divorced    Spouse name: Not on file  . Number of children: 0  . Years of education: Not on file  . Highest education level: Not on file  Social Needs  . Financial resource strain: Not on file  . Food insecurity - worry: Not on file  . Food insecurity - inability: Not on file  . Transportation needs - medical: Not on file  . Transportation needs - non-medical: Not on file  Occupational History  . Occupation: retired [employer].   Tobacco Use  . Smoking status: Former Smoker    Packs/day: 3.00    Years: 34.00    Pack years: 102.00    Types: Cigarettes    Last attempt to quit: 12/31/1988    Years since quitting: 29.1  . Smokeless  tobacco: Never Used  Substance and Sexual Activity  . Alcohol use: Yes    Alcohol/week: 1.8 oz    Types: 3 Cans of beer per week    Comment: occasional  . Drug use: No  . Sexual activity: Not on file  Other Topics Concern  . Not on file  Social History Narrative   Hobby computer light wts.   Family History  Problem Relation Age of Onset  . Hypertension Mother   . Colon cancer Father   . Hypertension Father   . Diabetes Other    No Known Allergies Prior to Admission medications   Medication Sig Start Date End Date Taking? Authorizing Provider  albuterol (PROAIR HFA) 108 (90 Base) MCG/ACT inhaler Inhale 2 puffs into the lungs every 6 (six) hours as needed for wheezing or shortness of breath. 05/22/17   Corwin Levins, MD  azithromycin (ZITHROMAX) 250 MG tablet Take 2 pills today then one a day for 4 additional days 11/27/17   Leslye Peer, MD  B Complex-C (SUPER B COMPLEX PO) Take by mouth daily.      [provider]  COD LIVER OIL PO Take by mouth daily.    [provider]  doxycycline (VIBRA-TABS) 100 MG tablet Take 1 tablet (100 mg total) by mouth 2 (two) times daily. 09/11/17   Leslye Peer, MD  ferrous gluconate (FERGON) 325 MG tablet Take 325 mg by mouth daily as needed.     [provider]  Fish Oil-Cholecalciferol (FISH OIL + D3 PO) Take by mouth daily.    [provider]  gabapentin (NEURONTIN) 300 MG capsule Take 1 capsule (300 mg total) by mouth at bedtime. 05/22/17   Corwin LevinsJohn, James W, MD  glucosamine-chondroitin 500-400 MG tablet Take 2 tablets by mouth daily.     [provider]  hydrochlorothiazide (HYDRODIURIL) 25 MG tablet Take 1 tablet (25 mg total) by mouth daily. 05/22/17   Corwin LevinsJohn, James W, MD  ipratropium-albuterol (DUONEB) 0.5-2.5 (3) MG/3ML SOLN Take 3 mLs by nebulization 4 (four) times daily. 10/31/17   Leslye PeerByrum, Robert S, MD  loratadine (CLARITIN) 10 MG tablet Take 1 tablet (10 mg total) by mouth daily. 05/22/17   Corwin LevinsJohn, James  W, MD  Magnesium 300 MG CAPS Take by mouth daily.      [provider]  Multiple Vitamin (MULTIVITAMIN) capsule Take 1 capsule by mouth daily.      [provider]  Selenium 200 MCG TABS Take 1 tablet by mouth daily.    [provider]  Turmeric 450 MG CAPS Take 1 capsule by mouth 2 (two) times daily.    [provider]  umeclidinium-vilanterol (ANORO ELLIPTA) 62.5-25 MCG/INH AEPB Inhale 1 puff into the lungs daily. 12/06/17   Nyoka CowdenWert, Michael B, MD  valsartan (DIOVAN) 80 MG tablet Take 1 tablet (80 mg total) by mouth daily. 10/31/17   Leslye PeerByrum, Robert S, MD     Positive ROS: negative  All other systems have been reviewed and were otherwise negative with the exception of those mentioned in the HPI and as above.  Physical Exam: There were no vitals filed for this visit.  General: Alert, no acute distress, wears BiPAP nasal cannula O2 Oral: Normal oral mucosa and tonsils Nasal: Clear nasal passages Neck: No palpable adenopathy or thyroid nodules. Thin neck without masses. Trachea midline Cardiovascular: Regular rate and rhythm, no murmur.  Respiratory: Clear to auscultation Neurologic: Alert and oriented x 3   Assessment/Plan: RESPIRATORY FAILURE Plan for Procedure(s): TRACHEOSTOMY   Dillard Cannonhristopher Valerio Pinard, MD 02/09/2018 3:00 PM

## 2018-02-10 ENCOUNTER — Ambulatory Visit (HOSPITAL_COMMUNITY): Payer: Medicare Other | Admitting: Anesthesiology

## 2018-02-10 ENCOUNTER — Telehealth: Payer: Self-pay | Admitting: *Deleted

## 2018-02-10 ENCOUNTER — Encounter (HOSPITAL_COMMUNITY): Payer: Self-pay | Admitting: Certified Registered Nurse Anesthetist

## 2018-02-10 ENCOUNTER — Encounter
Admission: EM | Disposition: A | Discharge status: Still In Hospital | Payer: Self-pay | Source: Other Acute Inpatient Hospital | Attending: Internal Medicine

## 2018-02-10 ENCOUNTER — Inpatient Hospital Stay
Admission: AD | Admit: 2018-02-10 | Discharge: 2018-02-28 | Disposition: E | Payer: Self-pay | Source: Ambulatory Visit | Attending: Internal Medicine | Admitting: Internal Medicine

## 2018-02-10 ENCOUNTER — Encounter (HOSPITAL_COMMUNITY): Payer: Self-pay | Admitting: Orthopedic Surgery

## 2018-02-10 ENCOUNTER — Encounter (HOSPITAL_COMMUNITY)
Admission: RE | Disposition: A | Discharge status: Skilled Nursing Facility | Payer: Self-pay | Source: Ambulatory Visit | Attending: Otolaryngology

## 2018-02-10 ENCOUNTER — Ambulatory Visit (HOSPITAL_COMMUNITY)
Admission: RE | Admit: 2018-02-10 | Discharge: 2018-02-10 | Disposition: A | Discharge status: Skilled Nursing Facility | Payer: Medicare Other | Source: Ambulatory Visit | Attending: Otolaryngology | Admitting: Otolaryngology

## 2018-02-10 ENCOUNTER — Other Ambulatory Visit (HOSPITAL_COMMUNITY): Payer: Self-pay

## 2018-02-10 DIAGNOSIS — Z79899 Other long term (current) drug therapy: Secondary | ICD-10-CM | POA: Diagnosis not present

## 2018-02-10 DIAGNOSIS — Z9981 Dependence on supplemental oxygen: Secondary | ICD-10-CM | POA: Diagnosis not present

## 2018-02-10 DIAGNOSIS — Z7901 Long term (current) use of anticoagulants: Secondary | ICD-10-CM | POA: Diagnosis not present

## 2018-02-10 DIAGNOSIS — I739 Peripheral vascular disease, unspecified: Secondary | ICD-10-CM | POA: Insufficient documentation

## 2018-02-10 DIAGNOSIS — E785 Hyperlipidemia, unspecified: Secondary | ICD-10-CM | POA: Diagnosis not present

## 2018-02-10 DIAGNOSIS — Z87891 Personal history of nicotine dependence: Secondary | ICD-10-CM | POA: Insufficient documentation

## 2018-02-10 DIAGNOSIS — G629 Polyneuropathy, unspecified: Secondary | ICD-10-CM | POA: Diagnosis not present

## 2018-02-10 DIAGNOSIS — G4733 Obstructive sleep apnea (adult) (pediatric): Secondary | ICD-10-CM | POA: Insufficient documentation

## 2018-02-10 DIAGNOSIS — J9610 Chronic respiratory failure, unspecified whether with hypoxia or hypercapnia: Secondary | ICD-10-CM | POA: Diagnosis not present

## 2018-02-10 DIAGNOSIS — I10 Essential (primary) hypertension: Secondary | ICD-10-CM | POA: Insufficient documentation

## 2018-02-10 DIAGNOSIS — J449 Chronic obstructive pulmonary disease, unspecified: Secondary | ICD-10-CM | POA: Insufficient documentation

## 2018-02-10 DIAGNOSIS — D729 Disorder of white blood cells, unspecified: Secondary | ICD-10-CM

## 2018-02-10 DIAGNOSIS — Z86711 Personal history of pulmonary embolism: Secondary | ICD-10-CM | POA: Insufficient documentation

## 2018-02-10 DIAGNOSIS — J8410 Pulmonary fibrosis, unspecified: Secondary | ICD-10-CM | POA: Diagnosis present

## 2018-02-10 HISTORY — PX: TRACHEOSTOMY TUBE PLACEMENT: SHX814

## 2018-02-10 LAB — URINALYSIS, ROUTINE W REFLEX MICROSCOPIC
BILIRUBIN URINE: NEGATIVE
Glucose, UA: NEGATIVE mg/dL
HGB URINE DIPSTICK: NEGATIVE
KETONES UR: NEGATIVE mg/dL
LEUKOCYTES UA: NEGATIVE
Nitrite: NEGATIVE
Protein, ur: 30 mg/dL — AB
SPECIFIC GRAVITY, URINE: 1.017 (ref 1.005–1.030)
SQUAMOUS EPITHELIAL / LPF: NONE SEEN
pH: 6 (ref 5.0–8.0)

## 2018-02-10 LAB — CBC
HEMATOCRIT: 60.2 % — AB (ref 39.0–52.0)
Hemoglobin: 20.1 g/dL — ABNORMAL HIGH (ref 13.0–17.0)
MCH: 35.2 pg — AB (ref 26.0–34.0)
MCHC: 33.4 g/dL (ref 30.0–36.0)
MCV: 105.4 fL — ABNORMAL HIGH (ref 78.0–100.0)
Platelets: 89 10*3/uL — ABNORMAL LOW (ref 150–400)
RBC: 5.71 MIL/uL (ref 4.22–5.81)
RDW: 16.8 % — AB (ref 11.5–15.5)
WBC: 19.1 10*3/uL — ABNORMAL HIGH (ref 4.0–10.5)

## 2018-02-10 LAB — HEPARIN INDUCED PLATELET AB (HIT ANTIBODY): Heparin Induced Plt Ab: 0.276 {OD_unit} (ref 0.000–0.400)

## 2018-02-10 SURGERY — CREATION, TRACHEOSTOMY
Anesthesia: Monitor Anesthesia Care | Site: Neck

## 2018-02-10 SURGERY — CREATION, TRACHEOSTOMY
Anesthesia: Monitor Anesthesia Care

## 2018-02-10 MED ORDER — ONDANSETRON HCL 4 MG/2ML IJ SOLN
INTRAMUSCULAR | Status: AC
  Filled 2018-02-10: qty 2

## 2018-02-10 MED ORDER — SUCCINYLCHOLINE CHLORIDE 200 MG/10ML IV SOSY
PREFILLED_SYRINGE | INTRAVENOUS | Status: AC
  Filled 2018-02-10: qty 10

## 2018-02-10 MED ORDER — CEFAZOLIN SODIUM-DEXTROSE 2-3 GM-%(50ML) IV SOLR
INTRAVENOUS | Status: DC | PRN
  Administered 2018-02-10: 2 g via INTRAVENOUS

## 2018-02-10 MED ORDER — LIDOCAINE-EPINEPHRINE 1 %-1:100000 IJ SOLN
INTRAMUSCULAR | Status: AC
  Filled 2018-02-10: qty 1

## 2018-02-10 MED ORDER — LACTATED RINGERS IV SOLN
INTRAVENOUS | Status: DC
  Administered 2018-02-10: 08:00:00 via INTRAVENOUS

## 2018-02-10 MED ORDER — CEFAZOLIN SODIUM-DEXTROSE 2-4 GM/100ML-% IV SOLN: 2.0000 g | INTRAVENOUS | Status: DC

## 2018-02-10 MED ORDER — EPHEDRINE 5 MG/ML INJ
INTRAVENOUS | Status: AC
  Filled 2018-02-10: qty 10

## 2018-02-10 MED ORDER — LIDOCAINE 2% (20 MG/ML) 5 ML SYRINGE
INTRAMUSCULAR | Status: AC
  Filled 2018-02-10: qty 5

## 2018-02-10 MED ORDER — FENTANYL CITRATE (PF) 250 MCG/5ML IJ SOLN
INTRAMUSCULAR | Status: AC
  Filled 2018-02-10: qty 5

## 2018-02-10 MED ORDER — PHENYLEPHRINE 40 MCG/ML (10ML) SYRINGE FOR IV PUSH (FOR BLOOD PRESSURE SUPPORT)
PREFILLED_SYRINGE | INTRAVENOUS | Status: AC
  Filled 2018-02-10: qty 10

## 2018-02-10 MED ORDER — CHLORHEXIDINE GLUCONATE CLOTH 2 % EX PADS: 6.0000 | MEDICATED_PAD | Freq: Once | CUTANEOUS | Status: DC

## 2018-02-10 MED ORDER — ROCURONIUM BROMIDE 10 MG/ML (PF) SYRINGE
PREFILLED_SYRINGE | INTRAVENOUS | Status: AC
  Filled 2018-02-10: qty 5

## 2018-02-10 MED ORDER — CEFAZOLIN SODIUM-DEXTROSE 2-4 GM/100ML-% IV SOLN
INTRAVENOUS | Status: AC
  Filled 2018-02-10: qty 100

## 2018-02-10 MED ORDER — LIDOCAINE-EPINEPHRINE 1 %-1:100000 IJ SOLN
INTRAMUSCULAR | Status: DC | PRN
  Administered 2018-02-10: 10 mL

## 2018-02-10 MED ORDER — PROPOFOL 10 MG/ML IV BOLUS
INTRAVENOUS | Status: AC
  Filled 2018-02-10: qty 20

## 2018-02-10 MED ORDER — DEXAMETHASONE SODIUM PHOSPHATE 10 MG/ML IJ SOLN
INTRAMUSCULAR | Status: AC
  Filled 2018-02-10: qty 1

## 2018-02-10 MED ORDER — SUGAMMADEX SODIUM 200 MG/2ML IV SOLN
INTRAVENOUS | Status: AC
  Filled 2018-02-10: qty 2

## 2018-02-10 MED ORDER — 0.9 % SODIUM CHLORIDE (POUR BTL) OPTIME
TOPICAL | Status: DC | PRN
  Administered 2018-02-10: 1000 mL

## 2018-02-10 SURGICAL SUPPLY — 44 items
ATTRACTOMAT 16X20 MAGNETIC DRP (DRAPES) IMPLANT
BLADE SURG 15 STRL LF DISP TIS (BLADE) ×1 IMPLANT
BLADE SURG 15 STRL SS (BLADE) ×2
CLEANER TIP ELECTROSURG 2X2 (MISCELLANEOUS) ×3 IMPLANT
COVER SURGICAL LIGHT HANDLE (MISCELLANEOUS) ×3 IMPLANT
DRAPE HALF SHEET 40X57 (DRAPES) IMPLANT
ELECT COATED BLADE 2.86 ST (ELECTRODE) ×3 IMPLANT
ELECT REM PT RETURN 9FT ADLT (ELECTROSURGICAL) ×3
ELECTRODE REM PT RTRN 9FT ADLT (ELECTROSURGICAL) ×1 IMPLANT
GAUZE SPONGE 4X4 16PLY XRAY LF (GAUZE/BANDAGES/DRESSINGS) ×3 IMPLANT
GEL ULTRASOUND 20GR AQUASONIC (MISCELLANEOUS) ×3 IMPLANT
GLOVE BIO SURGEON STRL SZ7.5 (GLOVE) ×3 IMPLANT
GLOVE BIOGEL PI IND STRL 6.5 (GLOVE) ×1 IMPLANT
GLOVE BIOGEL PI IND STRL 7.5 (GLOVE) ×1 IMPLANT
GLOVE BIOGEL PI INDICATOR 6.5 (GLOVE) ×2
GLOVE BIOGEL PI INDICATOR 7.5 (GLOVE) ×2
GLOVE SS BIOGEL STRL SZ 7.5 (GLOVE) ×1 IMPLANT
GLOVE SUPERSENSE BIOGEL SZ 7.5 (GLOVE) ×2
GOWN STRL REUS W/ TWL LRG LVL3 (GOWN DISPOSABLE) ×1 IMPLANT
GOWN STRL REUS W/ TWL XL LVL3 (GOWN DISPOSABLE) ×1 IMPLANT
GOWN STRL REUS W/TWL LRG LVL3 (GOWN DISPOSABLE) ×2
GOWN STRL REUS W/TWL XL LVL3 (GOWN DISPOSABLE) ×2
HOLDER TRACH TUBE VELCRO 19.5 (MISCELLANEOUS) ×3 IMPLANT
KIT BASIN OR (CUSTOM PROCEDURE TRAY) ×3 IMPLANT
KIT ROOM TURNOVER OR (KITS) ×3 IMPLANT
KIT SUCTION CATH 14FR (SUCTIONS) ×3 IMPLANT
NEEDLE HYPO 25GX1X1/2 BEV (NEEDLE) ×3 IMPLANT
NS IRRIG 1000ML POUR BTL (IV SOLUTION) ×3 IMPLANT
PACK EENT II TURBAN DRAPE (CUSTOM PROCEDURE TRAY) ×3 IMPLANT
PAD ARMBOARD 7.5X6 YLW CONV (MISCELLANEOUS) IMPLANT
PENCIL BUTTON HOLSTER BLD 10FT (ELECTRODE) ×3 IMPLANT
SPONGE DRAIN TRACH 4X4 STRL 2S (GAUZE/BANDAGES/DRESSINGS) ×3 IMPLANT
SPONGE INTESTINAL PEANUT (DISPOSABLE) ×3 IMPLANT
SUT SILK 2 0 SH CR/8 (SUTURE) ×3 IMPLANT
SUT SILK 3 0 TIES 10X30 (SUTURE) IMPLANT
SYR 5ML LUER SLIP (SYRINGE) ×3 IMPLANT
SYR CONTROL 10ML LL (SYRINGE) ×3 IMPLANT
TOWEL OR 17X24 6PK STRL BLUE (TOWEL DISPOSABLE) ×3 IMPLANT
TOWEL OR 17X26 10 PK STRL BLUE (TOWEL DISPOSABLE) ×3 IMPLANT
TUBE CONNECTING 12'X1/4 (SUCTIONS) ×1
TUBE CONNECTING 12X1/4 (SUCTIONS) ×2 IMPLANT
TUBE TRACH SHILE 4 NONFEN UNCU (TUBING) ×3 IMPLANT
TUBE TRACH SHILEY  6 DIST  CUF (TUBING) IMPLANT
TUBE TRACH SHILEY 8 DIST CUF (TUBING) IMPLANT

## 2018-02-10 NOTE — Transfer of Care (Addendum)
Immediate Anesthesia Transfer of Care Note  Patient: Derrick Petersen.  Procedure(s) Performed: TRACHEOSTOMY (N/A Neck)  [facility]  Anesthesia Type:MAC  Level of Consciousness: awake, alert , oriented and patient cooperative  Airway & Oxygen Therapy: Patient Spontanous Breathing and non-rebreather face mask  Post-op Assessment: Report given to RN and Post -op Vital signs reviewed and stable HR 110; spo2 96% BP 154/98  Post vital signs: Reviewed and stable  Last Vitals: There were no vitals filed for this visit.  Last Pain: There were no vitals filed for this visit.       Complications: No apparent anesthesia complications

## 2018-02-10 NOTE — Anesthesia Preprocedure Evaluation (Addendum)
Anesthesia Evaluation  Patient identified by MRN, date of birth, ID band Patient awake    Reviewed: Allergy & Precautions, H&P , NPO status , Patient's Chart, lab work & pertinent test results  Airway Mallampati: II  TM Distance: >3 FB Neck ROM: Full    Dental no notable dental hx. (+) Teeth Intact, Dental Advisory Given   Pulmonary COPD,  COPD inhaler and oxygen dependent, former smoker,  Severe interstitial lung disease   Pulmonary exam normal breath sounds clear to auscultation       Cardiovascular hypertension, Pt. on medications + Peripheral Vascular Disease   Rhythm:Regular Rate:Normal     Neuro/Psych negative neurological ROS  negative psych ROS   GI/Hepatic negative GI ROS, Neg liver ROS,   Endo/Other  negative endocrine ROS  Renal/GU negative Renal ROS  negative genitourinary   Musculoskeletal  (+) Arthritis , Osteoarthritis,    Abdominal   Peds  Hematology negative hematology ROS (+)   Anesthesia Other Findings   Reproductive/Obstetrics negative OB ROS                           Anesthesia Physical Anesthesia Plan  ASA: IV  Anesthesia Plan: MAC   Post-op Pain Management:    Induction: Intravenous  PONV Risk Score and Plan: 2  Airway Management Planned: Mask  Additional Equipment:   Intra-op Plan:   Post-operative Plan:   Informed Consent: I have reviewed the patients History and Physical, chart, labs and discussed the procedure including the risks, benefits and alternatives for the proposed anesthesia with the patient or authorized representative who has indicated his/her understanding and acceptance.   Dental advisory given  Plan Discussed with: CRNA  Anesthesia Plan Comments:       Anesthesia Quick Evaluation

## 2018-02-10 NOTE — Telephone Encounter (Signed)
Pt was on TCM list admitted 01/22/18 for pulmonary fibrosis with chronic O2 dependence. Was consulted for consideration of placement of a trach in order to place BiPAP therapy directly into the trachea versus transnasally as this may reduces O2 and flow rates. Patient has end-stage lung disease and is acceptable to this plan. He was taken to the operating room for tracheotomy under local anesthetic. Pt D/C 02/08/18, and sent to SNF.Marland Kitchen.Raechel Chute/lmb

## 2018-02-10 NOTE — Anesthesia Postprocedure Evaluation (Signed)
Anesthesia Post Note  Patient: Semaj Coburn.  Procedure(s) Performed: TRACHEOSTOMY (N/A Neck)     Patient location during evaluation: PACU Anesthesia Type: MAC Level of consciousness: awake and alert Pain management: pain level controlled Vital Signs Assessment: post-procedure vital signs reviewed and stable Respiratory status: spontaneous breathing, nonlabored ventilation, respiratory function stable and non-rebreather facemask Cardiovascular status: stable and blood pressure returned to baseline Postop Assessment: no apparent nausea or vomiting Anesthetic complications: no    Last Vitals: There were no vitals filed for this visit.  Last Pain: There were no vitals filed for this visit.               Zakyia Gagan,W. EDMOND

## 2018-02-10 NOTE — Interval H&P Note (Signed)
History and Physical Interval Note:  03-28-18 8:45 AM  Roderic PalauEarl Matzek Jr.  has presented today for surgery, with the diagnosis of trach  The various methods of treatment have been discussed with the patient and family. After consideration of risks, benefits and other options for treatment, the patient has consented to  Procedure(s): TRACHEOSTOMY (N/A) as a surgical intervention .  The patient's history has been reviewed, patient examined, no change in status, stable for surgery.  I have reviewed the patient's chart and labs.  Questions were answered to the patient's satisfaction.     Dillard Cannonhristopher Newman

## 2018-02-10 NOTE — Brief Op Note (Signed)
02/02/2018  10:12 AM  PATIENT:  Derrick PalauEarl Yonker Jr.  77 y.o. male  PRE-OPERATIVE DIAGNOSIS:  Chronic respiratory failure with hx of pulmonary fibrosis and OSA  POST-OPERATIVE DIAGNOSIS:  Same  PROCEDURE:  Procedure(s): TRACHEOSTOMY (N/A) # 4 Cuffless Shiley  SURGEON:  Surgeon(s) and Role:    Drema Halon* Tevita Gomer E, MD - Primary  PHYSICIAN ASSISTANT:   ASSISTANTS: none   ANESTHESIA:   local  EBL:  minimal   BLOOD ADMINISTERED:none  DRAINS: none   LOCAL MEDICATIONS USED:  XYLOCAINE with Epi 10 cc  SPECIMEN:  No Specimen  DISPOSITION OF SPECIMEN:  N/A  COUNTS:  YES  TOURNIQUET:  * No tourniquets in log *  DICTATION: .Other Dictation: Dictation Number [id number]  PLAN OF CARE: Discharge to home after PACU  PATIENT DISPOSITION:  PACU - hemodynamically stable.   Delay start of Pharmacological VTE agent (>24hrs) due to surgical blood loss or risk of bleeding: yes

## 2018-02-10 NOTE — Op Note (Signed)
NAME:  Derrick Petersen, Derrick Petersen                    ACCOUNT NO.:  MEDICAL RECORD NO.:  [id number]  LOCATION:                                 FACILITY:  PHYSICIAN:  Kristine GarbeChristopher E. Ezzard StandingNewman, M.D. DATE OF BIRTH:  DATE OF PROCEDURE:  02/05/2018 DATE OF DISCHARGE:                              OPERATIVE REPORT   PREOPERATIVE DIAGNOSIS:  Chronic respiratory failure with history of pulmonary fibrosis and obstructive sleep apnea.  POSTOPERATIVE DIAGNOSIS:  Chronic respiratory failure with history of pulmonary fibrosis and obstructive sleep apnea.  OPERATION PERFORMED:  Tracheostomy with a #4 cuffless Shiley trach.  SURGEON:  Kristine GarbeChristopher E. Ezzard StandingNewman, MD.  ANESTHESIA:  Local, 1% Xylocaine with 1:100,000 epinephrine, 10 mL.  BLOOD LOSS:  Minimal.  COMPLICATIONS:  None.  BRIEF CLINICAL NOTE:  Derrick Petersen is a 77 year old gentleman with idiopathic pulmonary fibrosis.  He is oxygen dependent, on high-flow O2. He also has history of obstructive sleep apnea, wears CPAP at night.  It was recommended that he have a tracheotomy to help reduce the high-flow O2 that is presently given intranasally 24/7.  This will also help with obstructive sleep apnea at night.  This was discussed with the patient and he is acceptable.  DESCRIPTION OF PROCEDURE:  The patient was brought from [facility] down to the hospital, remained in his bed in the supine position.  The patient was positioned comfortably with the neck extended.  The trachea was palpated in midline, was slightly low cricoid.  The proposed incision was marked out and it was injected with about 8 mL of Xylocaine with epinephrine.  A vertical incision was made down to the subcutaneous tissue.  Strap muscles were divided in midline and retracted laterally. The thyroid isthmus was a little bit on the high side and covered up the 1st and 2nd tracheal rings.  The thyroid isthmus was divided about 75% to expose the space between the 1st and 2nd tracheal  ring.  A hook was placed on the cricoid cartilage and extended his neck, and a horizontal tracheotomy was performed between the 1st and 2nd tracheal rings.  A vertical incision was made on either side with a horizontal incision with scissors.  The #4 cuffless Shiley tube was inserted without difficulty.  The patient oxygenated well with minimal coughing.  This was secured to the neck with 2-0 silk sutures x4 and Velcro trach collar around the neck.  The patient was subsequently transferred back to [facility].  The trach was corked as he was oxygenating well with present O2 BiPAP facemask.          ______________________________ Kristine Garbehristopher E. Ezzard StandingNewman, M.D.     CEN/MEDQ  D:  02/01/2018  T:  02/02/2018  Job:  [id number]

## 2018-02-11 ENCOUNTER — Encounter (HOSPITAL_COMMUNITY): Payer: Self-pay | Admitting: Otolaryngology

## 2018-02-11 ENCOUNTER — Encounter: Payer: Self-pay | Admitting: Certified Registered"

## 2018-02-11 ENCOUNTER — Ambulatory Visit (HOSPITAL_COMMUNITY): Admission: RE | Admit: 2018-02-11 | Payer: Medicare Other | Source: Ambulatory Visit

## 2018-02-11 IMAGING — CT CT CHEST W/ CM
2 of 3 series · 15 of 36 positions shown, 18 images · IV contrast (ISOVUE)
Comparison: 02/08/2016 high-resolution chest CT.

CLINICAL DATA: Hypoxemia. Shortness of breath on exertion. Former
smoker, quit in 9999.

EXAM:
CT CHEST WITH CONTRAST
TECHNIQUE: Multidetector CT imaging of the chest was performed during
intravenous contrast administration.
CONTRAST:  80 cc [id number] IV.

[Series 2: thorax · axial · 0.83mm/px · z∈[-193,+52]mm · 12 of 59 slices shown, 15 images]
[im 5/59  mediastinal]
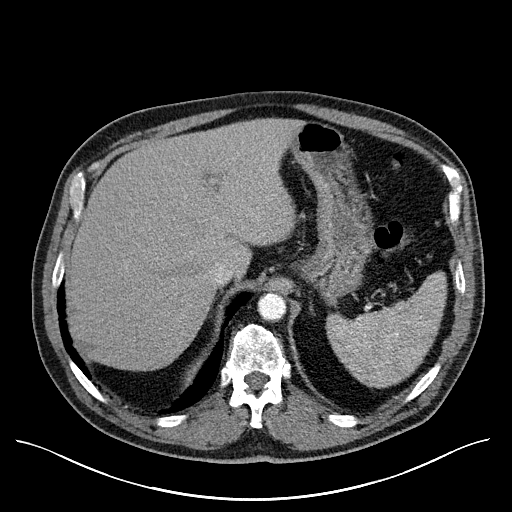
[im 5/59  lung]
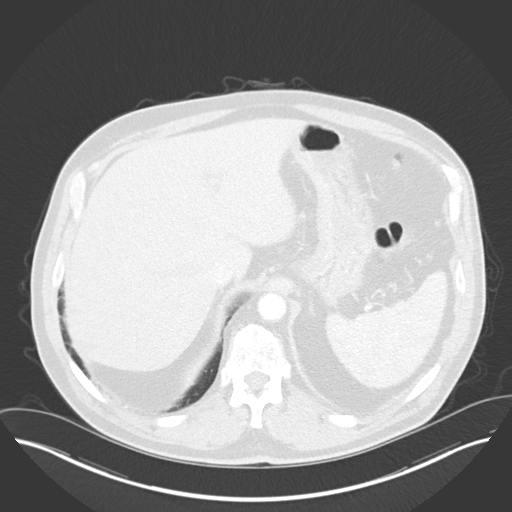
[im 9/59  lung]
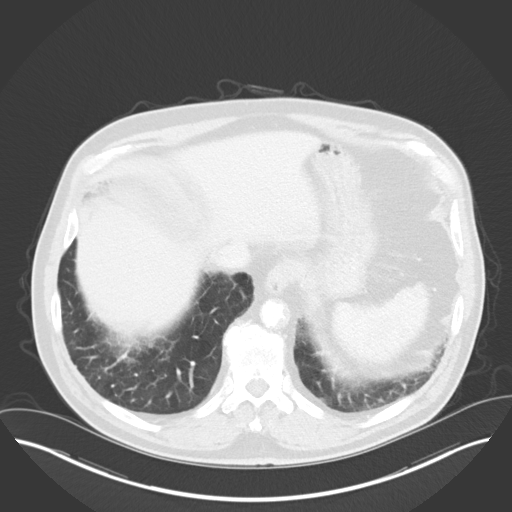
[im 13/59  lung]
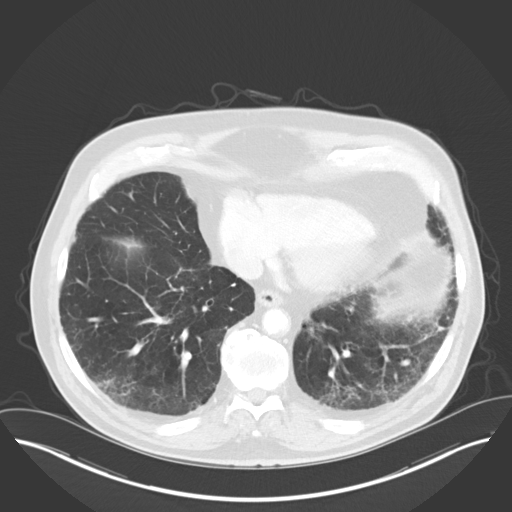
[im 18/59  lung]
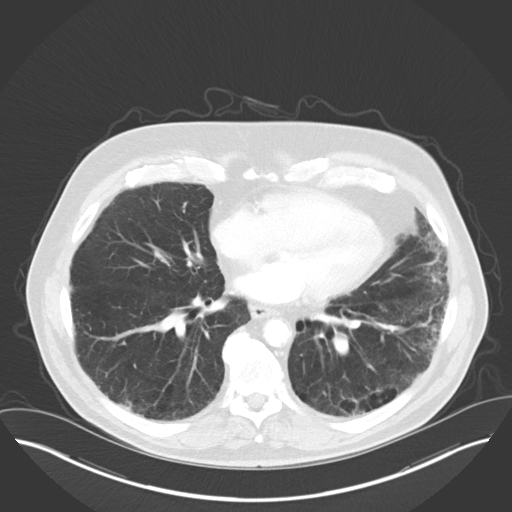
[im 22/59  mediastinal]
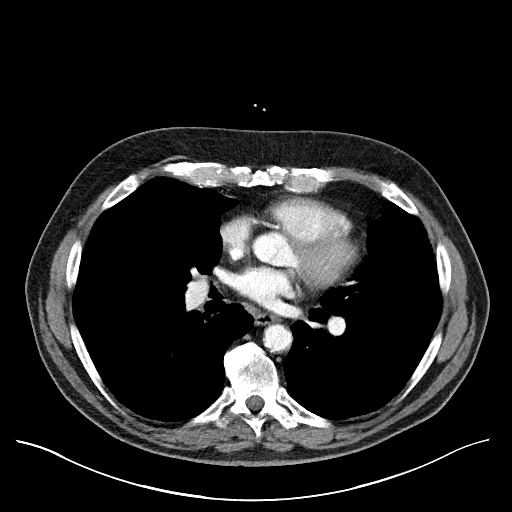
[im 22/59  lung]
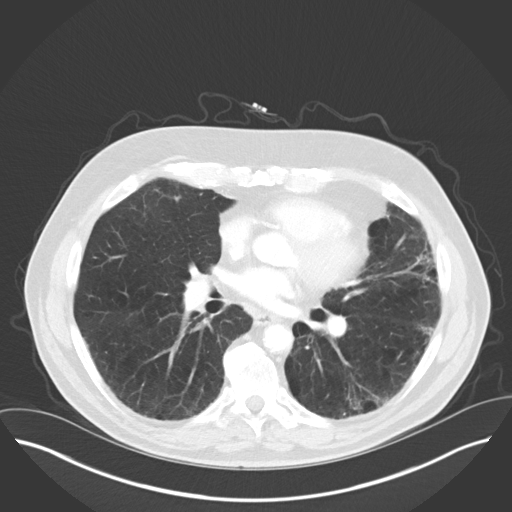
[im 26/59  lung]
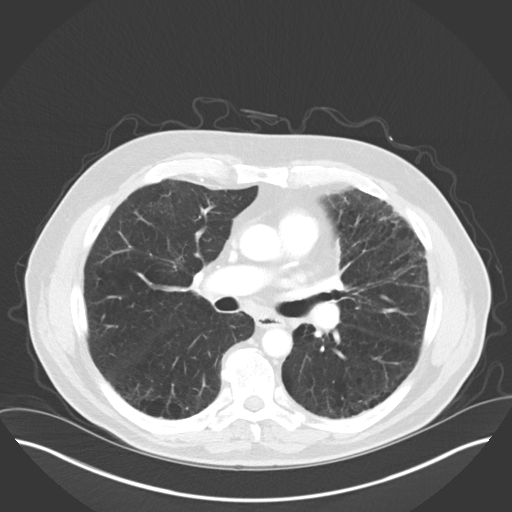
[im 33/59  lung]
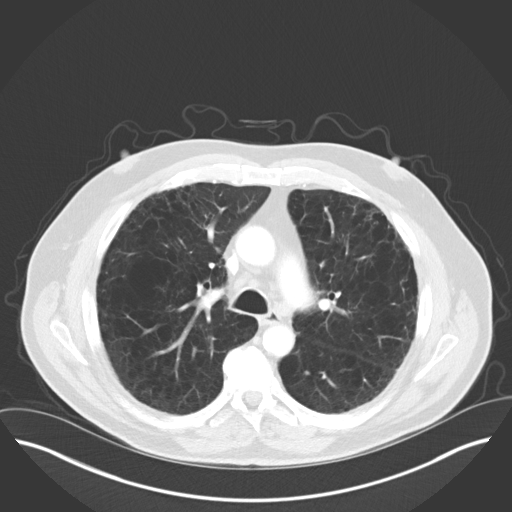
[im 37/59  lung]
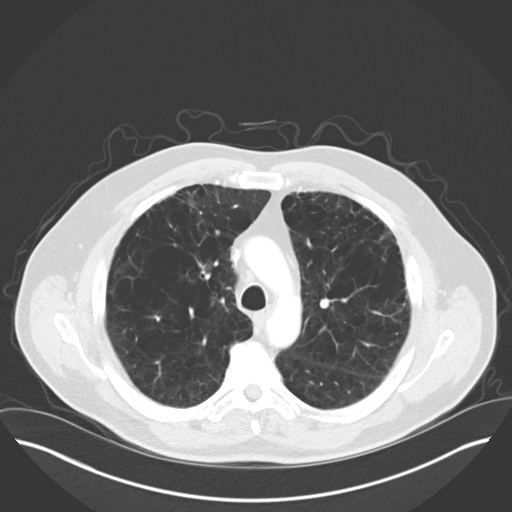
[im 41/59  mediastinal]
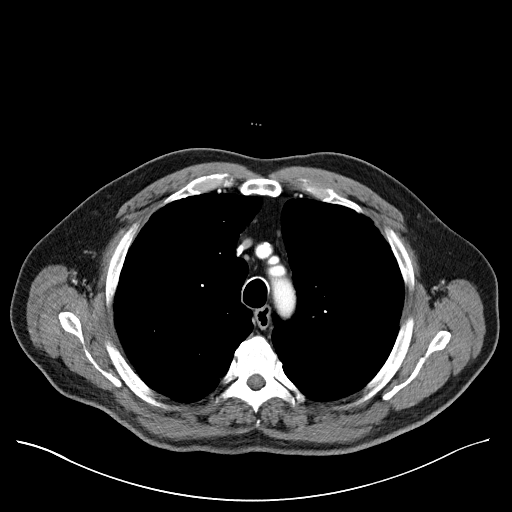
[im 41/59  lung]
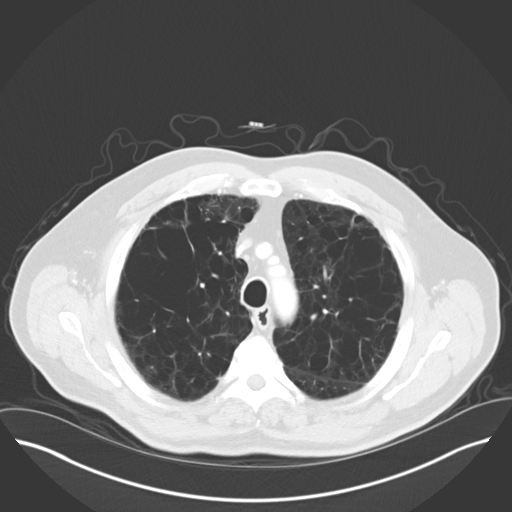
[im 46/59  lung]
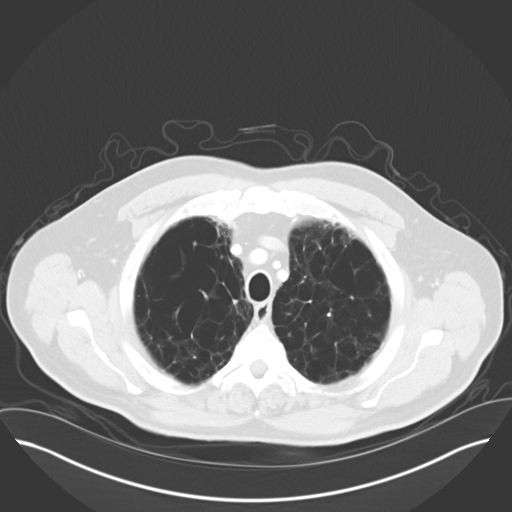
[im 50/59  lung]
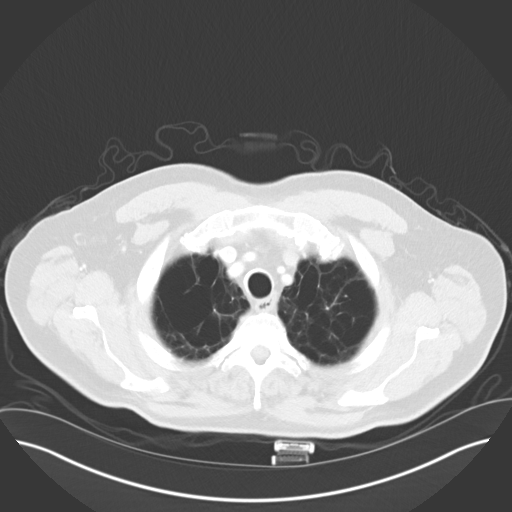
[im 54/59  lung]
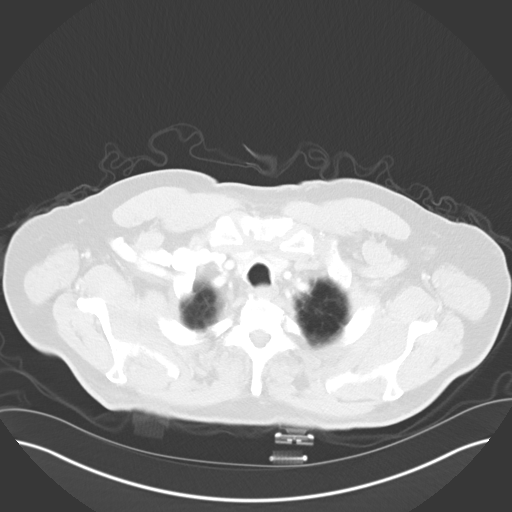

[Series 5: coronal · coronal · 0.59mm/px · 3 of 124 slices shown]
[im 25/124  lung]
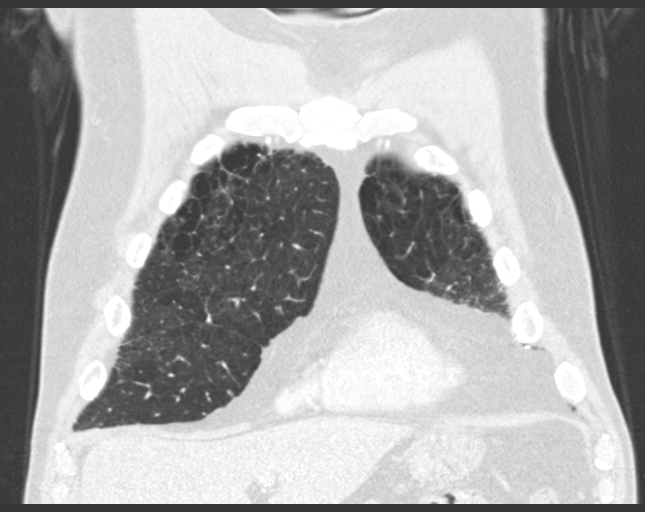
[im 50/124  lung]
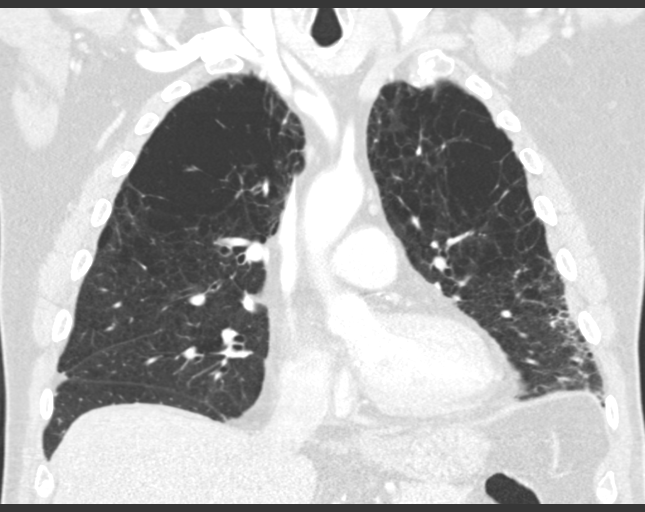
[im 74/124  lung]
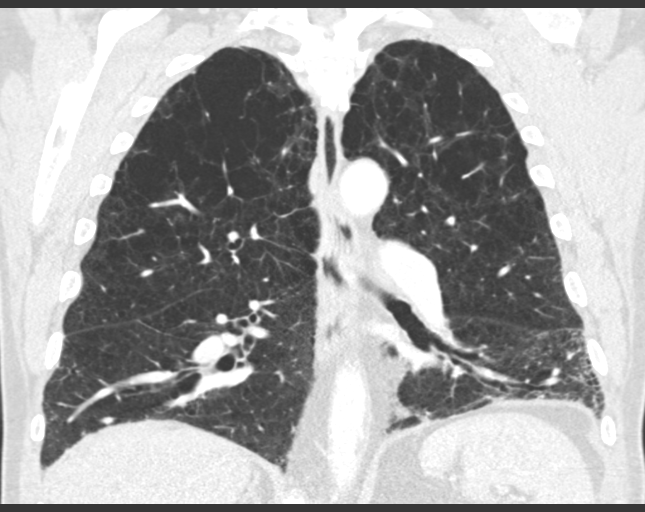

[15 of 36 positions shown; findings below may reference images not displayed]

FINDINGS: Mediastinum/Nodes: Normal heart size. No pericardial
fluid/thickening. Left main, left anterior descending and right
coronary atherosclerosis. Atherosclerotic nonaneurysmal thoracic
aorta. Top-normal caliber main pulmonary artery (3.0 cm diameter).
No central pulmonary emboli. Normal visualized thyroid. Normal
esophagus. No axillary adenopathy. Stable mildly enlarged 1.0 cm
right lower paratracheal node (series 2/ image 26) and 1.2 cm
subcarinal node (series 2/ image 35). No pathologically enlarged
hilar nodes.

Lungs/Pleura: No pneumothorax. No pleural effusion. No acute
consolidative airspace disease, significant pulmonary nodules or
lung masses. Moderate to severe centrilobular emphysema and diffuse
bronchial wall thickening. Patchy basilar predominant subpleural
reticulation and traction bronchiectasis and probable areas of mild
honeycombing in the lingula and left lower lobe, not appreciably
changed.

Upper abdomen: Unremarkable.

Musculoskeletal: No aggressive appearing focal osseous lesions.
Marked degenerative changes in the thoracic spine.
IMPRESSION: 1. Basilar predominant fibrotic interstitial lung disease with
probable mild honeycombing at the left lung base, not appreciably
changed compared to the 02/08/2016 high-resolution chest CT study,
representing possible usual interstitial pneumonia (UIP). A
follow-up high-resolution chest CT study was recommended for
January 2017 on the 02/08/2016 high-resolution chest CT report.
2. No acute consolidative airspace disease to suggest a pneumonia.
3. Moderate to severe emphysema and diffuse bronchial wall
thickening, suggesting COPD .
4. Left main and two-vessel coronary atherosclerosis.
5. Stable mild mediastinal lymphadenopathy, nonspecific and probably
reactive.

## 2018-02-12 LAB — URINE CULTURE

## 2018-02-12 MED FILL — Medication: Qty: 1 | Status: AC

## 2018-02-12 NOTE — Addendum Note (Signed)
Addendum  created 02/12/18 0908 by Achille RichHodierne, Adryan Druckenmiller, MD   Intraprocedure Attestations filed, Intraprocedure Event edited

## 2018-02-13 LAB — CULTURE, RESPIRATORY W GRAM STAIN

## 2018-02-13 LAB — CULTURE, RESPIRATORY: CULTURE: NORMAL

## 2018-02-28 NOTE — Anesthesia Procedure Notes (Signed)
Procedure Name: Intubation Date/Time: 03-02-18 12:09 AM Performed by: Babs Bertin, CRNA Pre-anesthesia Checklist: Patient identified, Emergency Drugs available, Suction available and Patient being monitored Patient Re-evaluated:Patient Re-evaluated prior to induction Oxygen Delivery Method: Ambu bag Preoxygenation: Pre-oxygenation with 100% oxygen Laryngoscope Size: Mac and 4 Grade View: Grade I Tube type: Subglottic suction tube Tube size: 7.5 mm Number of attempts: 1 Airway Equipment and Method: Stylet and Oral airway Placement Confirmation: ETT inserted through vocal cords under direct vision,  positive ETCO2 and breath sounds checked- equal and bilateral Secured at: 22 cm Tube secured with: Tape Dental Injury: Teeth and Oropharynx as per pre-operative assessment

## 2018-02-28 NOTE — Code Documentation (Signed)
  Patient Name: Derrick Palauarl Fessel Jr.   MRN: [id number]   Date of Birth/ Sex: 12/27/1941 , male      Admission Date: April 19, 2018  Attending Provider: Carron CurieHijazi, Ali, MD  Primary Diagnosis: acute and chronic respiratory failure with hypoxia   Indication: Pt was in his usual state of health until the PM of 2/11, when he was noted to be in PEA. Code blue was subsequently called. At the time of arrival on scene, ACLS protocol was underway.  Three minutes of CPR had been performed prior to the arrival of the code team with ROSC being obtained.   He again lost his pulse and resumption of CPR began as below.    Technical Description:  - CPR performance duration:  30 minutes  - Was defibrillation or cardioversion used? No   - Was external pacer placed? No  - Was patient intubated pre/post CPR? Yes   Medications Administered: Y = Yes; Blank = No Amiodarone    Atropine    Calcium    Epinephrine  Y  Lidocaine    Magnesium    Norepinephrine    Phenylephrine    Sodium bicarbonate  Y  Vasopressin    Other    Post CPR evaluation:  - Final Status - Was patient successfully resuscitated ? No   Miscellaneous Information:  - Time of death:  12:29 AM  - Primary team notified?  Yes  - Family Notified? No. Unable to reach family after multiple attempts.     Lanelle BalHarbrecht, Lezli Danek, MD   02/23/2018, 12:39 AM

## 2018-02-28 DEATH — deceased

## 2019-11-24 IMAGING — DX DG CHEST 1V PORT
1 series · 2 of 2 positions shown · non-contrast
Comparison: 01/16/2016

CLINICAL DATA: Respiratory failure.

EXAM:
PORTABLE CHEST 1 VIEW

[Series 1: chest · 0.14mm/px · 2 of 2 slices shown]
[im 1/2]
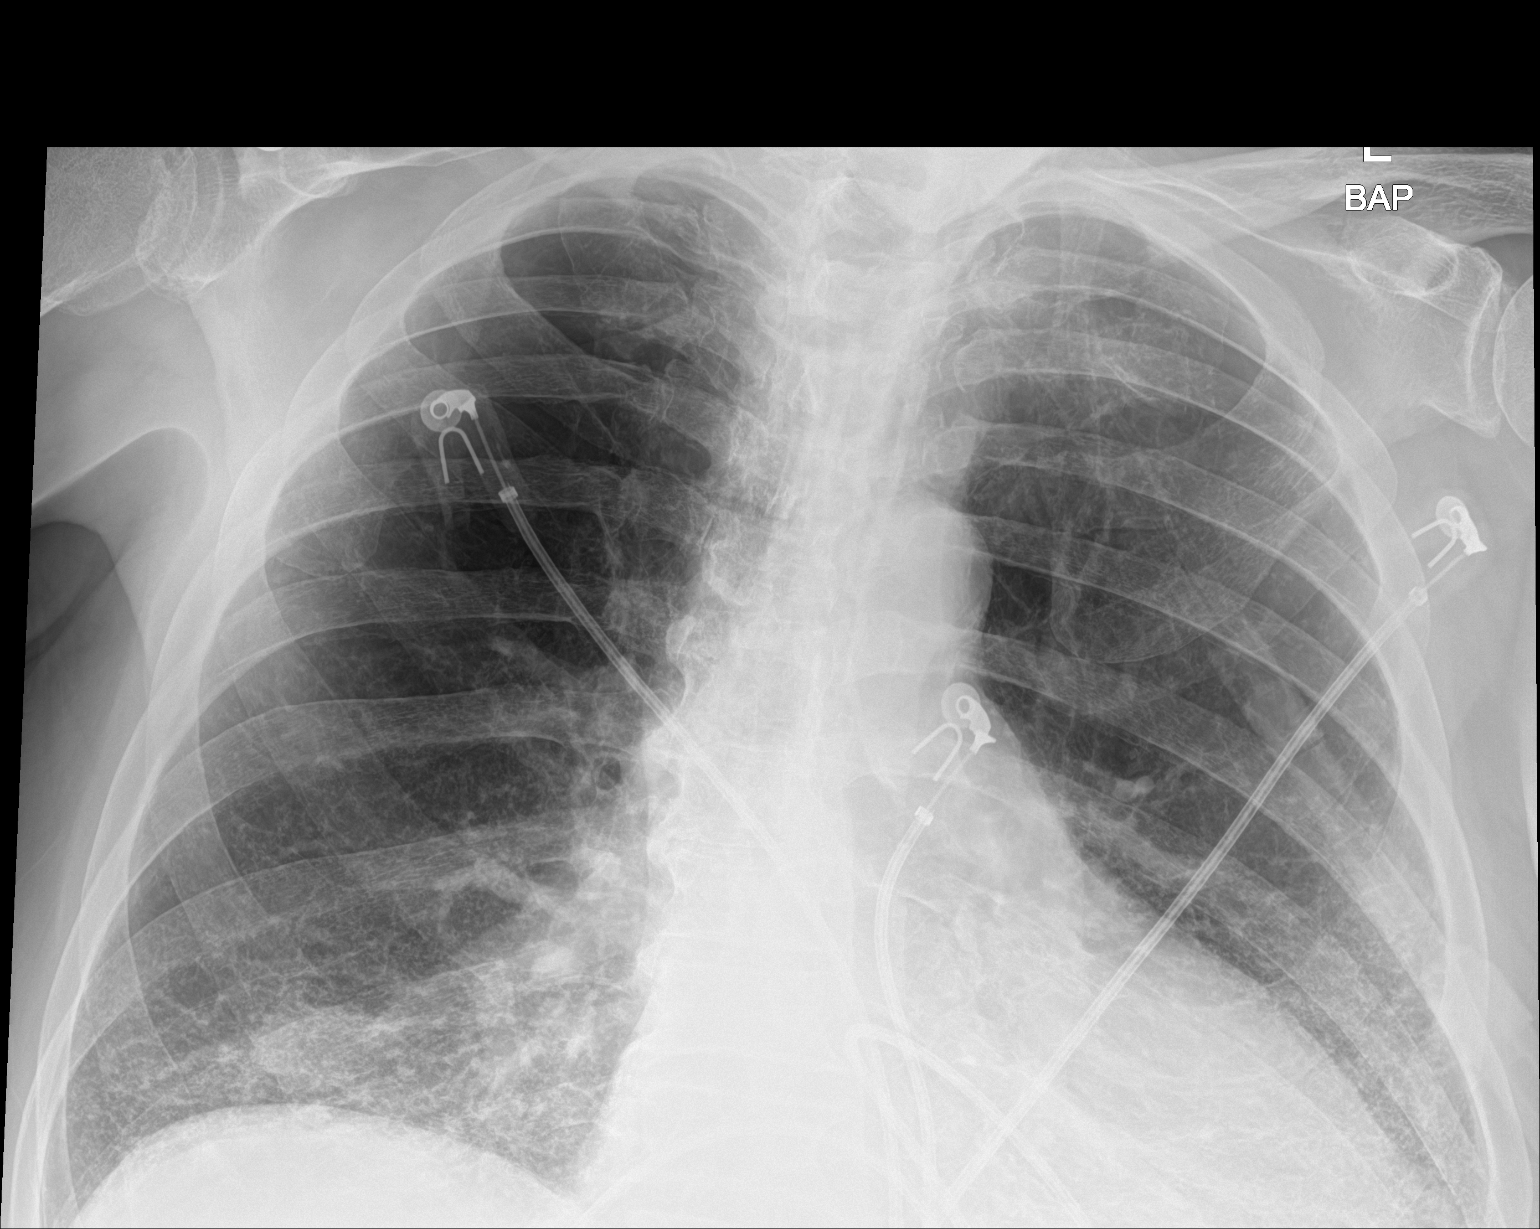
[im 2/2]
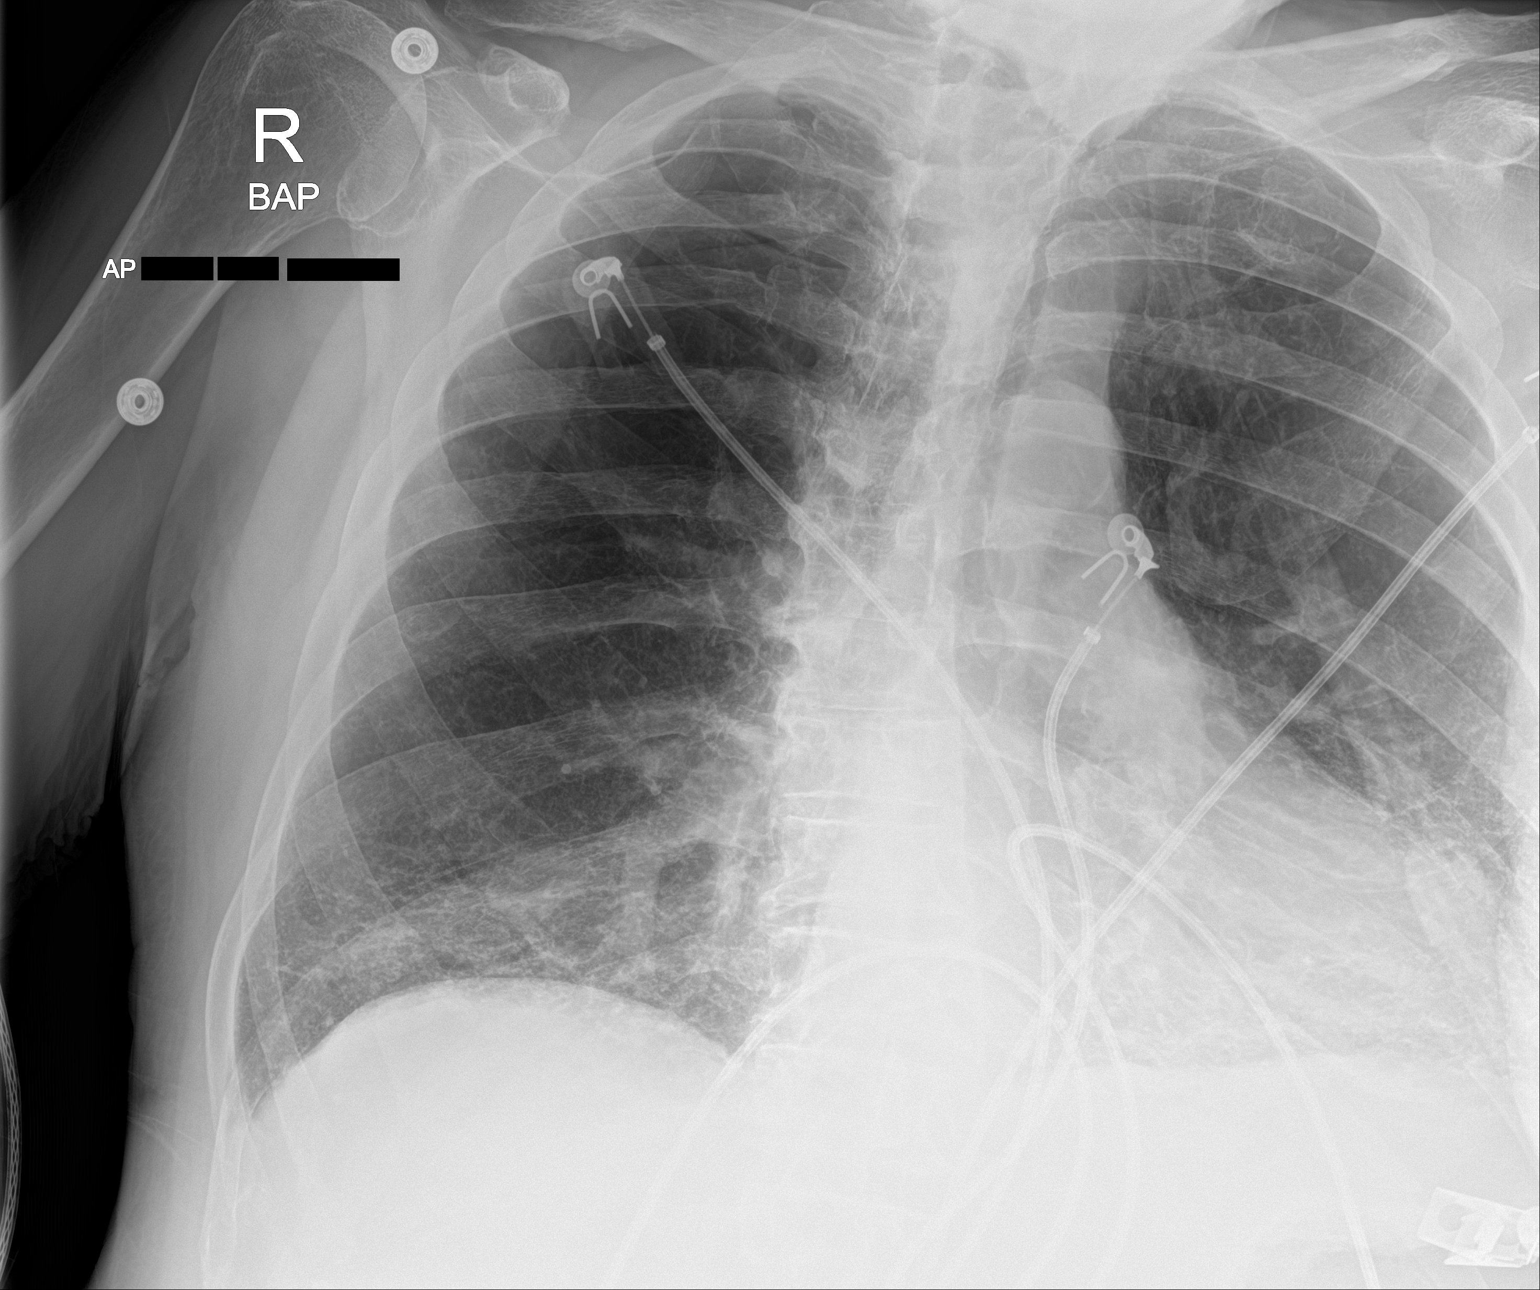

[2 of 2 positions shown; findings below may reference images not displayed]

FINDINGS: 9439 hours. Interval progression of chronic interstitial lung
disease in the bases, left greater than right. Emphysema with
bullous change noted, right greater than left. Cardiopericardial
silhouette is at upper limits of normal for size. No evidence for
pulmonary edema or pleural effusion although left costophrenic angle
has not been included on this study. The visualized bony structures
of the thorax are intact. Telemetry leads overlie the chest.
IMPRESSION: Emphysema with bibasilar chronic interstitial lung disease, left
greater than right.
